# Patient Record
Sex: Female | Born: 1955 | Race: White | Hispanic: No | Smoking: Former smoker
Health system: Southern US, Community
[De-identification: ages and names within clinical notes are randomized; demographics above are authoritative.]

## PROBLEM LIST (undated history)

## (undated) DIAGNOSIS — E119 Type 2 diabetes mellitus without complications: Secondary | ICD-10-CM

## (undated) DIAGNOSIS — E1140 Type 2 diabetes mellitus with diabetic neuropathy, unspecified: Secondary | ICD-10-CM

## (undated) DIAGNOSIS — E785 Hyperlipidemia, unspecified: Secondary | ICD-10-CM

## (undated) DIAGNOSIS — M791 Myalgia, unspecified site: Secondary | ICD-10-CM

## (undated) DIAGNOSIS — I10 Essential (primary) hypertension: Secondary | ICD-10-CM

## (undated) DIAGNOSIS — F411 Generalized anxiety disorder: Secondary | ICD-10-CM

## (undated) DIAGNOSIS — K635 Polyp of colon: Secondary | ICD-10-CM

## (undated) DIAGNOSIS — J45909 Unspecified asthma, uncomplicated: Secondary | ICD-10-CM

## (undated) DIAGNOSIS — G4733 Obstructive sleep apnea (adult) (pediatric): Secondary | ICD-10-CM

## (undated) DIAGNOSIS — J302 Other seasonal allergic rhinitis: Secondary | ICD-10-CM

## (undated) DIAGNOSIS — G4730 Sleep apnea, unspecified: Secondary | ICD-10-CM

## (undated) DIAGNOSIS — E1143 Type 2 diabetes mellitus with diabetic autonomic (poly)neuropathy: Secondary | ICD-10-CM

## (undated) HISTORY — DX: Other seasonal allergic rhinitis: J30.2

## (undated) HISTORY — DX: Type 2 diabetes mellitus without complications: E11.9

## (undated) HISTORY — PX: SHOULDER ARTHROSCOPY: SHX128

## (undated) HISTORY — DX: Hyperlipidemia, unspecified: E78.5

## (undated) HISTORY — DX: Polyp of colon: K63.5

## (undated) HISTORY — PX: OTHER SURGICAL HISTORY: SHX169

---

## 1993-12-20 HISTORY — PX: ABDOMINAL HYSTERECTOMY: SHX81

## 1999-03-30 ENCOUNTER — Ambulatory Visit (HOSPITAL_COMMUNITY): Admission: RE | Admit: 1999-03-30 | Discharge: 1999-03-30 | Payer: Self-pay | Admitting: Endocrinology

## 1999-03-30 ENCOUNTER — Encounter: Payer: Self-pay | Admitting: Endocrinology

## 2001-01-17 ENCOUNTER — Inpatient Hospital Stay (HOSPITAL_COMMUNITY): Admission: AD | Admit: 2001-01-17 | Discharge: 2001-01-18 | Payer: Self-pay | Admitting: General Surgery

## 2001-01-30 ENCOUNTER — Encounter: Payer: Self-pay | Admitting: Surgery

## 2001-01-30 ENCOUNTER — Ambulatory Visit (HOSPITAL_COMMUNITY): Admission: RE | Admit: 2001-01-30 | Discharge: 2001-01-30 | Payer: Self-pay | Admitting: Surgery

## 2001-02-20 ENCOUNTER — Encounter: Payer: Self-pay | Admitting: Surgery

## 2001-02-20 ENCOUNTER — Ambulatory Visit (HOSPITAL_COMMUNITY): Admission: RE | Admit: 2001-02-20 | Discharge: 2001-02-20 | Payer: Self-pay | Admitting: Surgery

## 2001-10-05 ENCOUNTER — Ambulatory Visit (HOSPITAL_COMMUNITY): Admission: RE | Admit: 2001-10-05 | Discharge: 2001-10-05 | Payer: Self-pay | Admitting: Internal Medicine

## 2001-10-05 ENCOUNTER — Encounter: Payer: Self-pay | Admitting: Internal Medicine

## 2004-09-30 ENCOUNTER — Ambulatory Visit (HOSPITAL_COMMUNITY): Admission: RE | Admit: 2004-09-30 | Discharge: 2004-09-30 | Payer: Self-pay | Admitting: Specialist

## 2004-11-11 ENCOUNTER — Ambulatory Visit: Payer: Self-pay | Admitting: Unknown Physician Specialty

## 2005-03-09 ENCOUNTER — Ambulatory Visit: Payer: Self-pay | Admitting: Endocrinology

## 2005-04-27 ENCOUNTER — Ambulatory Visit: Payer: Self-pay | Admitting: Endocrinology

## 2005-04-30 ENCOUNTER — Ambulatory Visit: Payer: Self-pay | Admitting: Endocrinology

## 2005-06-28 ENCOUNTER — Ambulatory Visit: Payer: Self-pay | Admitting: Endocrinology

## 2005-06-30 ENCOUNTER — Ambulatory Visit: Payer: Self-pay | Admitting: Endocrinology

## 2005-07-27 ENCOUNTER — Ambulatory Visit: Payer: Self-pay | Admitting: Endocrinology

## 2005-08-17 ENCOUNTER — Ambulatory Visit: Payer: Self-pay | Admitting: Internal Medicine

## 2005-09-22 ENCOUNTER — Ambulatory Visit: Payer: Self-pay | Admitting: Endocrinology

## 2005-09-24 ENCOUNTER — Ambulatory Visit: Payer: Self-pay | Admitting: Endocrinology

## 2005-10-07 ENCOUNTER — Ambulatory Visit: Payer: Self-pay | Admitting: Internal Medicine

## 2005-10-08 ENCOUNTER — Ambulatory Visit: Payer: Self-pay

## 2005-12-20 HISTORY — PX: SHOULDER SURGERY: SHX246

## 2006-04-14 ENCOUNTER — Emergency Department: Payer: Self-pay | Admitting: Internal Medicine

## 2006-05-30 DIAGNOSIS — E1165 Type 2 diabetes mellitus with hyperglycemia: Secondary | ICD-10-CM | POA: Insufficient documentation

## 2006-05-30 DIAGNOSIS — E7800 Pure hypercholesterolemia, unspecified: Secondary | ICD-10-CM | POA: Insufficient documentation

## 2006-11-01 ENCOUNTER — Ambulatory Visit: Payer: Self-pay | Admitting: Family Medicine

## 2007-03-28 ENCOUNTER — Ambulatory Visit: Payer: Self-pay | Admitting: Unknown Physician Specialty

## 2007-11-07 ENCOUNTER — Ambulatory Visit: Payer: Self-pay | Admitting: Unknown Physician Specialty

## 2008-05-02 ENCOUNTER — Emergency Department: Payer: Self-pay | Admitting: Emergency Medicine

## 2008-06-25 ENCOUNTER — Ambulatory Visit: Payer: Self-pay | Admitting: Gastroenterology

## 2008-07-18 ENCOUNTER — Ambulatory Visit: Payer: Self-pay | Admitting: Gastroenterology

## 2008-09-30 ENCOUNTER — Ambulatory Visit: Payer: Self-pay | Admitting: Family Medicine

## 2008-10-09 ENCOUNTER — Ambulatory Visit: Payer: Self-pay | Admitting: Unknown Physician Specialty

## 2008-10-20 ENCOUNTER — Ambulatory Visit: Payer: Self-pay | Admitting: Unknown Physician Specialty

## 2008-11-07 ENCOUNTER — Ambulatory Visit: Payer: Self-pay | Admitting: Unknown Physician Specialty

## 2008-11-19 ENCOUNTER — Ambulatory Visit: Payer: Self-pay | Admitting: Unknown Physician Specialty

## 2008-12-16 DIAGNOSIS — G4730 Sleep apnea, unspecified: Secondary | ICD-10-CM | POA: Insufficient documentation

## 2008-12-20 ENCOUNTER — Ambulatory Visit: Payer: Self-pay | Admitting: Unknown Physician Specialty

## 2009-02-27 ENCOUNTER — Ambulatory Visit: Payer: Self-pay | Admitting: Family Medicine

## 2009-02-27 DIAGNOSIS — M542 Cervicalgia: Secondary | ICD-10-CM | POA: Insufficient documentation

## 2009-02-27 DIAGNOSIS — J45909 Unspecified asthma, uncomplicated: Secondary | ICD-10-CM | POA: Insufficient documentation

## 2009-09-03 ENCOUNTER — Ambulatory Visit: Payer: Self-pay | Admitting: Gastroenterology

## 2009-11-10 ENCOUNTER — Ambulatory Visit: Payer: Self-pay | Admitting: Unknown Physician Specialty

## 2010-04-28 ENCOUNTER — Emergency Department: Payer: Self-pay | Admitting: Emergency Medicine

## 2010-09-29 ENCOUNTER — Ambulatory Visit: Payer: Self-pay | Admitting: Unknown Physician Specialty

## 2011-01-14 ENCOUNTER — Ambulatory Visit: Payer: Self-pay | Admitting: Internal Medicine

## 2011-04-17 ENCOUNTER — Emergency Department: Payer: Self-pay | Admitting: Emergency Medicine

## 2011-09-30 ENCOUNTER — Ambulatory Visit: Payer: Self-pay | Admitting: Family Medicine

## 2011-11-17 ENCOUNTER — Ambulatory Visit: Payer: Self-pay | Admitting: Internal Medicine

## 2012-10-04 ENCOUNTER — Ambulatory Visit: Payer: Self-pay | Admitting: Family Medicine

## 2012-10-31 ENCOUNTER — Ambulatory Visit: Payer: Self-pay | Admitting: Family Medicine

## 2012-10-31 LAB — CREATININE, SERUM: EGFR (African American): >60

## 2013-02-28 ENCOUNTER — Encounter: Payer: Self-pay | Admitting: Family Medicine

## 2013-02-28 ENCOUNTER — Ambulatory Visit (INDEPENDENT_AMBULATORY_CARE_PROVIDER_SITE_OTHER): Payer: BC Managed Care – PPO | Admitting: Family Medicine

## 2013-02-28 VITALS — BP 120/74 | HR 86 | Temp 98.2°F | Ht 62.0 in | Wt 190.0 lb

## 2013-02-28 DIAGNOSIS — E785 Hyperlipidemia, unspecified: Secondary | ICD-10-CM

## 2013-02-28 DIAGNOSIS — E119 Type 2 diabetes mellitus without complications: Secondary | ICD-10-CM

## 2013-02-28 DIAGNOSIS — M549 Dorsalgia, unspecified: Secondary | ICD-10-CM

## 2013-02-28 MED ORDER — TIZANIDINE HCL 4 MG PO TABS: 4.0000 mg | ORAL_TABLET | Freq: Every evening | ORAL | Status: DC

## 2013-02-28 NOTE — Patient Instructions (Addendum)
[facility] 1. Work on pretzel stretching, shoulder back and leg draped in front. 3-5 sets, 30 sec.. 3. cross over stretching - shoulder back to ground, same side leg crossover. 3-5 sets for 30 min..  4. SINK STRETCH - YOU CAN DO THIS WHENEVER YOU WANT DURING THE DAY

## 2013-02-28 NOTE — Progress Notes (Signed)
Nature conservation officer at [facility] [address] [place] [address] Phone: [phone] Fax: [phone]  Date:  02/28/2013   Name:  Jessica Elliott   DOB:  08/01/1956   MRN:  [id number] Gender: female Age: 57 y.o.  Primary Physician:  Hannah Beat, MD  Evaluating MD: Hannah Beat, MD   Chief Complaint: Establish Care and Back Pain   History of Present Illness:  Jessica Elliott is a 57 y.o. pleasant patient who presents with the following:  New patient, here to see me on a consultant basis, Woke with posterior pain, and some numbness in the L leg. Pain in the left side. Not radiating. "Pulling sensation."   L upper thigh. Woke up and her back hurting.  Some pain in the buttocks. No trauma, accident. No bowel or bladder incontinence.   Got a little better. Heat and ice for fifteen minutes.  Computer job.   DM decently controlled.   There is no problem list on file for this patient.   Past Medical History  Diagnosis Date  . Hyperlipidemia   . Diabetes mellitus without complication   . Allergy   . Polyp of colon     Past Surgical History  Procedure Laterality Date  . Abdominal hysterectomy    . Shoulder arthroscopy      History   Social History  . Marital Status: Single    Spouse Name: N/A    Number of Children: N/A  . Years of Education: N/A   Occupational History  . Not on file.   Social History Main Topics  . Smoking status: Former Games developer  . Smokeless tobacco: Not on file  . Alcohol Use: Yes  . Drug Use: No  . Sexually Active: Not on file   Other Topics Concern  . Not on file   Social History Narrative  . No narrative on file    No family history on file.  Allergies  Allergen Reactions  . Codeine Hives  . Penicillins Hives    Medication list has been reviewed and updated.  No outpatient prescriptions prior to visit.   No facility-administered medications prior to visit.    Review of Systems:   GEN: No  fevers, chills. Nontoxic. Primarily MSK c/o today. MSK: Detailed in the HPI GI: tolerating PO intake without difficulty Neuro: as above Otherwise the pertinent positives of the ROS are noted above.    Physical Examination: BP 120/74  Pulse 86  Temp(Src) 98.2 F (36.8 C) (Oral)  Ht 5\' 2"  (1.575 m)  Wt 190 lb (86.183 kg)  BMI 34.74 kg/m2  SpO2 97%  Ideal Body Weight: Weight in (lb) to have BMI = 25: 136.4   GEN: Well-developed,well-nourished,in no acute distress; alert,appropriate and cooperative throughout examination HEENT: Normocephalic and atraumatic without obvious abnormalities. Ears, externally no deformities PULM: Breathing comfortably in no respiratory distress EXT: No clubbing, cyanosis, or edema PSYCH: Normally interactive. Cooperative during the interview. Pleasant. Friendly and conversant. Not anxious or depressed appearing. Normal, full affect.  Range of motion at  the waist: Flexion, extension, lateral bending and rotation: approaching normal  No echymosis or edema Rises to examination table with mild difficulty Gait: minimally antalgic  Inspection/Deformity: N Paraspinus Tenderness: mild l3-5, but more in upper posterior buttocks  B Ankle Dorsiflexion (L5,4): 5/5 B Great Toe Dorsiflexion (L5,4): 5/5 Heel Walk (L5): WNL Toe Walk (S1): WNL Rise/Squat (L4): WNL, mild pain  SENSORY B Medial Foot (L4): WNL B Dorsum (L5): WNL B Lateral (S1): WNL  Light Touch: ? Mild altered sensation upper L thigh  REFLEXES Knee (L4): 2+ Ankle (S1): 2+  B SLR, seated: neg B SLR, supine: neg B FABER: neg B Reverse FABER: neg B Greater Troch: NT B Log Roll: neg B Stork: NT B Sciatic Notch: NT   Assessment and Plan:  Back pain  Diabetes mellitus without complication  Hyperlipidemia  Back pain, sensation potentially slightly altered, tight hips. But motion preserved. Proceed with conservative therapy, rehab, muscle relaxants at night. Back and hip rehab  reviewed.  Orders Today:  No orders of the defined types were placed in this encounter.    Updated Medication List: (Includes new medications, updates to list, dose adjustments) Meds ordered this encounter  Medications  . insulin lispro (HUMALOG) 100 UNIT/ML injection    Sig: 16 unit am 18 noon 20 dinner  . insulin glargine (LANTUS) 100 UNIT/ML injection    Sig: Inject 46 Units into the skin at bedtime.  . metFORMIN (GLUCOPHAGE) 1000 MG tablet    Sig: Take 1000 mg in am and 1500 mg at bedtime  . lovastatin (MEVACOR) 10 MG tablet    Sig: Take 10 mg by mouth at bedtime.  Marland Kitchen aspirin 81 MG chewable tablet    Sig: Chew 81 mg by mouth daily.  Marland Kitchen tiZANidine (ZANAFLEX) 4 MG tablet    Sig: Take 1 tablet (4 mg total) by mouth Nightly.    Dispense:  30 tablet    Refill:  2    Medications Discontinued: There are no discontinued medications.    Signed, Elpidio Galea. Copland, MD 02/28/2013 9:46 AM

## 2013-03-02 ENCOUNTER — Encounter: Payer: Self-pay | Admitting: Family Medicine

## 2013-03-02 DIAGNOSIS — E119 Type 2 diabetes mellitus without complications: Secondary | ICD-10-CM | POA: Insufficient documentation

## 2013-03-02 DIAGNOSIS — E785 Hyperlipidemia, unspecified: Secondary | ICD-10-CM | POA: Insufficient documentation

## 2013-04-04 ENCOUNTER — Ambulatory Visit: Payer: BC Managed Care – PPO | Admitting: Family Medicine

## 2013-09-27 ENCOUNTER — Ambulatory Visit: Payer: Self-pay | Admitting: Family Medicine

## 2014-05-23 LAB — BASIC METABOLIC PANEL
BUN: 14 mg/dL (ref 4–21)
Creatinine: 0.6 mg/dL (ref 0.5–1.1)
GLUCOSE: 157 mg/dL
Potassium: 4.8 mmol/L (ref 3.4–5.3)
SODIUM: 141 mmol/L (ref 137–147)

## 2014-05-23 LAB — HEMOGLOBIN A1C: Hgb A1c MFr Bld: 7.1 % — AB (ref 4.0–6.0)

## 2014-05-23 LAB — POCT ERYTHROCYTE SEDIMENTATION RATE, NON-AUTOMATED: Sed Rate: 3 mm

## 2014-05-28 ENCOUNTER — Ambulatory Visit: Payer: Self-pay | Admitting: Family Medicine

## 2014-09-18 LAB — LIPID PANEL
CHOLESTEROL: 174 mg/dL (ref 0–200)
HDL: 63 mg/dL (ref 35–70)
LDL CALC: 95 mg/dL
Triglycerides: 82 mg/dL (ref 40–160)

## 2014-09-18 LAB — HEPATIC FUNCTION PANEL
ALT: 21 U/L (ref 7–35)
AST: 19 U/L (ref 13–35)
Alkaline Phosphatase: 56 U/L (ref 25–125)
Bilirubin, Total: 0.3 mg/dL

## 2014-10-04 ENCOUNTER — Ambulatory Visit: Payer: Self-pay | Admitting: Family Medicine

## 2014-10-08 ENCOUNTER — Other Ambulatory Visit: Payer: Self-pay | Admitting: Family Medicine

## 2014-10-09 ENCOUNTER — Other Ambulatory Visit: Payer: Self-pay | Admitting: Family Medicine

## 2014-10-09 DIAGNOSIS — N6459 Other signs and symptoms in breast: Secondary | ICD-10-CM

## 2014-10-18 ENCOUNTER — Ambulatory Visit
Admission: RE | Admit: 2014-10-18 | Discharge: 2014-10-18 | Disposition: A | Discharge status: Home / Self Care | Payer: BC Managed Care – PPO | Source: Ambulatory Visit | Attending: Family Medicine | Admitting: Family Medicine

## 2014-10-18 DIAGNOSIS — N6459 Other signs and symptoms in breast: Secondary | ICD-10-CM

## 2014-12-17 ENCOUNTER — Ambulatory Visit: Payer: Self-pay | Admitting: Gastroenterology

## 2015-04-14 LAB — SURGICAL PATHOLOGY

## 2015-04-29 DIAGNOSIS — K5790 Diverticulosis of intestine, part unspecified, without perforation or abscess without bleeding: Secondary | ICD-10-CM | POA: Insufficient documentation

## 2015-04-29 DIAGNOSIS — K649 Unspecified hemorrhoids: Secondary | ICD-10-CM | POA: Insufficient documentation

## 2015-04-29 DIAGNOSIS — B029 Zoster without complications: Secondary | ICD-10-CM | POA: Insufficient documentation

## 2015-04-29 DIAGNOSIS — K635 Polyp of colon: Secondary | ICD-10-CM | POA: Insufficient documentation

## 2015-04-29 DIAGNOSIS — F419 Anxiety disorder, unspecified: Secondary | ICD-10-CM | POA: Insufficient documentation

## 2015-04-29 DIAGNOSIS — E559 Vitamin D deficiency, unspecified: Secondary | ICD-10-CM | POA: Insufficient documentation

## 2015-04-29 DIAGNOSIS — M19049 Primary osteoarthritis, unspecified hand: Secondary | ICD-10-CM | POA: Insufficient documentation

## 2015-04-29 DIAGNOSIS — R768 Other specified abnormal immunological findings in serum: Secondary | ICD-10-CM | POA: Insufficient documentation

## 2015-04-29 DIAGNOSIS — D649 Anemia, unspecified: Secondary | ICD-10-CM | POA: Insufficient documentation

## 2015-04-29 DIAGNOSIS — M7710 Lateral epicondylitis, unspecified elbow: Secondary | ICD-10-CM | POA: Insufficient documentation

## 2015-04-29 DIAGNOSIS — N6459 Other signs and symptoms in breast: Secondary | ICD-10-CM | POA: Insufficient documentation

## 2015-06-16 ENCOUNTER — Other Ambulatory Visit: Payer: Self-pay | Admitting: Family Medicine

## 2015-06-25 ENCOUNTER — Encounter: Payer: Self-pay | Admitting: Physician Assistant

## 2015-06-25 ENCOUNTER — Ambulatory Visit (INDEPENDENT_AMBULATORY_CARE_PROVIDER_SITE_OTHER): Payer: BLUE CROSS/BLUE SHIELD | Admitting: Physician Assistant

## 2015-06-25 VITALS — BP 132/60 | HR 80 | Temp 98.0°F | Resp 16 | Ht 62.0 in | Wt 202.0 lb

## 2015-06-25 DIAGNOSIS — E1165 Type 2 diabetes mellitus with hyperglycemia: Secondary | ICD-10-CM

## 2015-06-25 DIAGNOSIS — R42 Dizziness and giddiness: Secondary | ICD-10-CM

## 2015-06-25 DIAGNOSIS — E7800 Pure hypercholesterolemia, unspecified: Secondary | ICD-10-CM

## 2015-06-25 DIAGNOSIS — I739 Peripheral vascular disease, unspecified: Secondary | ICD-10-CM | POA: Diagnosis not present

## 2015-06-25 DIAGNOSIS — R600 Localized edema: Secondary | ICD-10-CM | POA: Diagnosis not present

## 2015-06-25 DIAGNOSIS — G4700 Insomnia, unspecified: Secondary | ICD-10-CM | POA: Diagnosis not present

## 2015-06-25 DIAGNOSIS — E559 Vitamin D deficiency, unspecified: Secondary | ICD-10-CM | POA: Diagnosis not present

## 2015-06-25 DIAGNOSIS — IMO0002 Reserved for concepts with insufficient information to code with codable children: Secondary | ICD-10-CM

## 2015-06-25 MED ORDER — FUROSEMIDE 20 MG PO TABS
20.0000 mg | ORAL_TABLET | Freq: Every day | ORAL | Status: DC
Start: 2015-06-25 — End: 2015-10-02

## 2015-06-25 NOTE — Progress Notes (Signed)
Subjective:     Patient ID: Jessica Elliott, female   DOB: [birth year], 59 y.o.   MRN: [id number]  Anxiety Presents for follow-up visit. Onset was 1 to 6 months ago (at least 3 month). The problem has been unchanged. Symptoms include dizziness, insomnia and shortness of breath. Patient reports no chest pain, depressed mood, excessive worry, feeling of choking, hyperventilation, nausea, palpitations or suicidal ideas. Symptoms occur constantly. The severity of symptoms is moderate. Nothing aggravates the symptoms. The quality of sleep is poor. Nighttime awakenings: several.   There are no known risk factors. Treatments tried: stared Xanax 0.5mg  1 tablet at bedtime. The treatment provided no relief. Compliance with prior treatments has been good.  Leg Pain  The incident occurred more than 1 week ago (has been ongoing for at over 3 months now). There was no injury mechanism. The pain is present in the left leg and right leg. The quality of the pain is described as aching and burning. The pain is moderate (can be severe). The pain has been constant since onset. Associated symptoms include numbness (bilateral feet and legs) and tingling. The symptoms are aggravated by weight bearing. She has tried elevation and NSAIDs (also has tried TED hose ) for the symptoms. The treatment provided no relief.  Dizziness This is a new problem. The current episode started more than 1 month ago. The problem occurs 2 to 4 times per day. The problem has been unchanged. Associated symptoms include arthralgias (left wrist), fatigue and numbness (bilateral feet and legs). Pertinent negatives include no abdominal pain, anorexia, change in bowel habit, chest pain, chills, congestion, coughing, diaphoresis, fever, headaches, joint swelling, myalgias, nausea, neck pain, rash, sore throat, swollen glands, urinary symptoms, vertigo, visual change (recently went to opthamologist and had normal exam), vomiting or weakness. The symptoms are  aggravated by exertion and walking. She has tried drinking, eating and rest for the symptoms. The treatment provided no relief.  Patient was seen in the office on 04/29/15 and was started on Xanax 0.5mg  as needed at bedtime. Patient reports that she can tell a difference in the amount of hours she is getting a night. However, she still has the "funny feeling" in her head throughout the day. Patient thinks that the sensation she is having in her head and leg pain is related.   Review of Systems  Constitutional: Positive for fatigue. Negative for fever, chills, diaphoresis, activity change and appetite change.  HENT: Negative for congestion, sore throat, tinnitus, trouble swallowing and voice change.   Eyes: Negative.   Respiratory: Positive for shortness of breath. Negative for cough, chest tightness and wheezing.   Cardiovascular: Positive for leg swelling. Negative for chest pain and palpitations.  Gastrointestinal: Negative for nausea, vomiting, abdominal pain, diarrhea, constipation, anorexia and change in bowel habit.  Endocrine: Negative.   Genitourinary: Negative.   Musculoskeletal: Positive for arthralgias (left wrist). Negative for myalgias, joint swelling and neck pain.  Skin: Negative for rash.  Allergic/Immunologic: Negative.   Neurological: Positive for dizziness, tingling, light-headedness and numbness (bilateral feet and legs). Negative for vertigo, weakness and headaches.  Hematological: Negative.   Psychiatric/Behavioral: Negative for suicidal ideas. The patient has insomnia.        Objective:   Physical Exam  Constitutional: She is oriented to person, place, and time. She appears well-developed and well-nourished. No distress.  Eyes: Pupils are equal, round, and reactive to light.  Cardiovascular: Normal rate, regular rhythm and normal heart sounds.  Exam reveals no gallop and no friction  rub.   No murmur heard. Pulses:      Dorsalis pedis pulses are 1+ on the right side,  and 1+ on the left side.  Pulmonary/Chest: Effort normal and breath sounds normal. No respiratory distress. She has no wheezes. She has no rales.  Neurological: She is alert and oriented to person, place, and time. No cranial nerve deficit. Coordination normal.  Skin: She is not diaphoretic.  Psychiatric: She has a normal mood and affect. Her behavior is normal. Judgment and thought content normal.  Vitals reviewed.      Assessment:     1. Dizziness   2. Hypercholesterolemia without hypertriglyceridemia   3. Diabetes mellitus type 2, uncontrolled   4. Avitaminosis D   5. Bilateral leg edema   6. Middle insomnia   7. Claudication of both lower extremities        Plan:     1. Dizziness Will check labs.  Advised to make sure she is drinking plenty of fluids.  Will recheck in 2 weeks.  If no improvement will refer to Dr. Rowan Blase Neurology (she has seen him previously for headache). - TSH - Comprehensive Metabolic Panel (CMET)  2. Hypercholesterolemia without hypertriglyceridemia Check labs and f/u pending labs. - Lipid panel  3. Diabetes mellitus type 2, uncontrolled Will check labs to make sure blood glucose is not extremely uncontrolled causing progression of symptoms.  She is followed by Endocrinology for her T2DM. - HgB A1c - CBC w/Diff  4. Avitaminosis D Hx of this and currently taking a vitamin D supplement but labs have not been checked in over one year.  F/U pending labs. - Vitamin D (25 hydroxy)  5. Bilateral leg edema Will check labs.  Start furosemide as below.  Will follow up in 2 weeks. - Comprehensive Metabolic Panel (CMET) - furosemide (LASIX) 20 MG tablet; Take 1 tablet (20 mg total) by mouth daily.  Dispense: 30 tablet; Refill: 3  6. Middle insomnia Discussed possible use of zolpidem 5 mg to help her stay alseep.  She does not wish to proceed with this at this time.  Also discussed possibly having her repeat a sleep study to make sure her CPAP  parameters are still appropriate as she has had her current machine for many years.  7. Claudication of both lower extremities Will check ABIs.  She does complain of fatigue and burning in her legs from her buttocks and calf muscles bilaterally even after walking short distances.  She did have palpable DP pulses bilaterally on exam and feet are warm to the touch with no discoloration.  Will obtain ABI when she returns to R/O vascular involvement.  I question more likely a progression of neuropathy.  If ABIs are stable will discuss trial of gabapentin.  - ABI; Future

## 2015-07-02 ENCOUNTER — Other Ambulatory Visit: Payer: Self-pay | Admitting: Family Medicine

## 2015-07-10 ENCOUNTER — Telehealth: Payer: Self-pay | Admitting: Physician Assistant

## 2015-07-10 LAB — COMPREHENSIVE METABOLIC PANEL
ALK PHOS: 55 IU/L (ref 39–117)
ALT: 24 IU/L (ref 0–32)
AST: 21 IU/L (ref 0–40)
Albumin/Globulin Ratio: 2.1 (ref 1.1–2.5)
Albumin: 4.6 g/dL (ref 3.5–5.5)
BUN / CREAT RATIO: 22 (ref 9–23)
BUN: 15 mg/dL (ref 6–24)
Bilirubin Total: 0.4 mg/dL (ref 0.0–1.2)
CO2: 25 mmol/L (ref 18–29)
Calcium: 8.9 mg/dL (ref 8.7–10.2)
Chloride: 102 mmol/L (ref 97–108)
Creatinine, Ser: 0.67 mg/dL (ref 0.57–1.00)
GFR calc Af Amer: 113 mL/min/{1.73_m2} (ref >59)
GFR, EST NON AFRICAN AMERICAN: 98 mL/min/{1.73_m2} (ref >59)
GLUCOSE: 143 mg/dL — AB (ref 65–99)
Globulin, Total: 2.2 g/dL (ref 1.5–4.5)
Potassium: 4.5 mmol/L (ref 3.5–5.2)
Sodium: 143 mmol/L (ref 134–144)
TOTAL PROTEIN: 6.8 g/dL (ref 6.0–8.5)

## 2015-07-10 LAB — LIPID PANEL
CHOL/HDL RATIO: 2.5 ratio (ref 0.0–4.4)
Cholesterol, Total: 133 mg/dL (ref 100–199)
HDL: 54 mg/dL (ref >39)
LDL CALC: 62 mg/dL (ref 0–99)
Triglycerides: 85 mg/dL (ref 0–149)
VLDL Cholesterol Cal: 17 mg/dL (ref 5–40)

## 2015-07-10 LAB — CBC WITH DIFFERENTIAL/PLATELET
BASOS: 0 %
Basophils Absolute: 0 10*3/uL (ref 0.0–0.2)
EOS (ABSOLUTE): 0.1 10*3/uL (ref 0.0–0.4)
EOS: 3 %
HEMATOCRIT: 37.8 % (ref 34.0–46.6)
HEMOGLOBIN: 12.5 g/dL (ref 11.1–15.9)
IMMATURE GRANULOCYTES: 0 %
Immature Grans (Abs): 0 10*3/uL (ref 0.0–0.1)
Lymphocytes Absolute: 1.8 10*3/uL (ref 0.7–3.1)
Lymphs: 38 %
MCH: 25.6 pg — ABNORMAL LOW (ref 26.6–33.0)
MCHC: 33.1 g/dL (ref 31.5–35.7)
MCV: 78 fL — ABNORMAL LOW (ref 79–97)
Monocytes Absolute: 0.5 10*3/uL (ref 0.1–0.9)
Monocytes: 11 %
Neutrophils Absolute: 2.2 10*3/uL (ref 1.4–7.0)
Neutrophils: 48 %
Platelets: 190 10*3/uL (ref 150–379)
RBC: 4.88 x10E6/uL (ref 3.77–5.28)
RDW: 14.5 % (ref 12.3–15.4)
WBC: 4.7 10*3/uL (ref 3.4–10.8)

## 2015-07-10 LAB — HEMOGLOBIN A1C
Est. average glucose Bld gHb Est-mCnc: 177 mg/dL
Hgb A1c MFr Bld: 7.8 % — ABNORMAL HIGH (ref 4.8–5.6)

## 2015-07-10 LAB — TSH: TSH: 2.82 u[IU]/mL (ref 0.450–4.500)

## 2015-07-10 LAB — VITAMIN D 25 HYDROXY (VIT D DEFICIENCY, FRACTURES): VIT D 25 HYDROXY: 30 ng/mL (ref 30.0–100.0)

## 2015-07-10 NOTE — Telephone Encounter (Signed)
Left message to Sabrina from [facility] health regarding pt's order for ABI.

## 2015-07-10 NOTE — Telephone Encounter (Signed)
Tried to call, waited for over 15 mins.  Will call back.

## 2015-07-10 NOTE — Telephone Encounter (Signed)
Received an incoming call from [place] from [facility] regarding an ABI order, she wanted to know if you want the pt to get it there that she also will need a Lower arterial order, and would like to know when to schedule it.

## 2015-07-10 NOTE — Telephone Encounter (Signed)
No we are doing it in the office tomorrow.  This was the patient we were working on the in-office workflow for this procedure.  I think she is supposed to get it tomorrow in the office around 1030.

## 2015-07-11 ENCOUNTER — Encounter (INDEPENDENT_AMBULATORY_CARE_PROVIDER_SITE_OTHER): Payer: BLUE CROSS/BLUE SHIELD

## 2015-07-11 ENCOUNTER — Encounter: Payer: Self-pay | Admitting: Physician Assistant

## 2015-07-11 ENCOUNTER — Ambulatory Visit (INDEPENDENT_AMBULATORY_CARE_PROVIDER_SITE_OTHER): Payer: BLUE CROSS/BLUE SHIELD | Admitting: Physician Assistant

## 2015-07-11 VITALS — BP 120/60 | HR 80 | Temp 97.3°F | Resp 17 | Wt 200.0 lb

## 2015-07-11 DIAGNOSIS — G8929 Other chronic pain: Secondary | ICD-10-CM

## 2015-07-11 DIAGNOSIS — M25521 Pain in right elbow: Secondary | ICD-10-CM | POA: Diagnosis not present

## 2015-07-11 DIAGNOSIS — R600 Localized edema: Secondary | ICD-10-CM | POA: Diagnosis not present

## 2015-07-11 DIAGNOSIS — E1140 Type 2 diabetes mellitus with diabetic neuropathy, unspecified: Secondary | ICD-10-CM | POA: Insufficient documentation

## 2015-07-11 DIAGNOSIS — E1165 Type 2 diabetes mellitus with hyperglycemia: Secondary | ICD-10-CM

## 2015-07-11 DIAGNOSIS — E1142 Type 2 diabetes mellitus with diabetic polyneuropathy: Secondary | ICD-10-CM | POA: Diagnosis not present

## 2015-07-11 DIAGNOSIS — I951 Orthostatic hypotension: Secondary | ICD-10-CM | POA: Diagnosis not present

## 2015-07-11 DIAGNOSIS — IMO0002 Reserved for concepts with insufficient information to code with codable children: Secondary | ICD-10-CM

## 2015-07-11 MED ORDER — FUROSEMIDE 20 MG PO TABS: 20.0000 mg | ORAL_TABLET | Freq: Every day | ORAL | Status: DC

## 2015-07-11 MED ORDER — HYDROCODONE-ACETAMINOPHEN 10-325 MG PO TABS: 1.0000 | ORAL_TABLET | Freq: Four times a day (QID) | ORAL | Status: DC | PRN

## 2015-07-11 NOTE — Patient Instructions (Signed)

## 2015-07-11 NOTE — Progress Notes (Signed)
Patient: Jessica Elliott Female    DOB: Jun 16, 1956   59 y.o.   MRN: [id number] Visit Date: 07/11/2015  Today's Provider: Mar Daring, PA-C   Chief Complaint  Patient presents with  . Follow-up    Dizziness, Leg Edema   Subjective:    HPI Jessica Elliott is a 59 year old female that returns to the office today for follow-up of her bilateral lower extremity edema and dizziness. She does feel that the Lasix that was given to her at the previous visit has helped her lower extremity edema. She also feels that the dizziness is not as frequent. She does however report that there are still some days when she has to sit a lot at work she will feel her legs become tight and feel very swollen and heavy. When she has these sensations in her legs is when she will notice the dizziness upon standing. She states the dizziness only occurs at work and it only occurs after she has been sitting for prolonged period of time and when she stands up she will feel lightheaded and her head will throb and she feels flushed in the face. She states these episodes only last for approximately 10-20 seconds and normally resolve if she sits down. She has been trying to increase, she is walking daily and feels this has helped her lower extremity swelling as well.    Allergies  Allergen Reactions  . Ibuprofen Other (See Comments)    Drowsy  . Statins Other (See Comments)  . Codeine Hives  . Penicillins Hives   Previous Medications   ALBUTEROL (PROVENTIL HFA;VENTOLIN HFA) 108 (90 BASE) MCG/ACT INHALER    Inhale 2 puffs into the lungs every 6 (six) hours as needed.   ALPRAZOLAM (XANAX) 0.5 MG TABLET    Take 1 tablet by mouth every 8 (eight) hours as needed.   ASPIRIN 81 MG TABLET    Take 81 mg by mouth daily.   DAPAGLIFLOZIN PROPANEDIOL (FARXIGA) 5 MG TABS TABLET    1 tablet daily.   FUROSEMIDE (LASIX) 20 MG TABLET    Take 1 tablet (20 mg total) by mouth daily.   HYDROCODONE-ACETAMINOPHEN (NORCO) 10-325  MG PER TABLET    Take 1 tablet by mouth every 6 (six) hours as needed.   INSULIN ASPART (NOVOLOG) 100 UNIT/ML INJECTION    Inject into the skin. 16-18-20 three times daily   INSULIN GLARGINE (LANTUS) 100 UNIT/ML INJECTION    Inject 46 Units into the skin at bedtime.   INSULIN PEN NEEDLE (B-D ULTRAFINE III SHORT PEN) 31G X 8 MM MISC    Inject insulin 4 times daily   LANTUS SOLOSTAR 100 UNIT/ML SOLOSTAR PEN       LORATADINE (CLARITIN) 10 MG TABLET    Take 1 tablet by mouth daily.   LOVASTATIN (MEVACOR) 40 MG TABLET       METFORMIN (GLUCOPHAGE) 1000 MG TABLET    Take 1000 mg in am and 1500 mg at bedtime   TIZANIDINE (ZANAFLEX) 4 MG TABLET    Take 1 tablet (4 mg total) by mouth Nightly.   VITAMIN D, ERGOCALCIFEROL, (DRISDOL) 50000 UNITS CAPS CAPSULE    TAKE 1 CAPSULE ONCE A MONTH    Review of Systems  Constitutional: Negative.   HENT: Negative.   Eyes: Negative.   Respiratory: Negative.   Cardiovascular: Negative.   Gastrointestinal: Negative.   Endocrine: Negative.   Genitourinary: Negative.   Musculoskeletal: Positive for joint swelling (Just the leg swelling).  Skin: Negative.   Allergic/Immunologic:       Seasonal alllergies  Neurological: Positive for dizziness.  Hematological: Negative.   Psychiatric/Behavioral: Negative.     History  Substance Use Topics  . Smoking status: Former Research scientist (life sciences)  . Smokeless tobacco: Never Used  . Alcohol Use: 0.0 oz/week    0 Standard drinks or equivalent per week     Comment: occasionally   Objective:   BP 120/60 mmHg  Pulse 80  Temp(Src) 97.3 F (36.3 C) (Oral)  Resp 17  Wt 200 lb (90.719 kg)  Physical Exam  Constitutional: She appears well-developed and well-nourished. No distress.  HENT:  Head: Normocephalic and atraumatic.  Right Ear: Hearing, tympanic membrane, external ear and ear canal normal.  Left Ear: Hearing, tympanic membrane, external ear and ear canal normal.  Eyes: Pupils are equal, round, and reactive to light.  Neck:  Carotid bruit is not present.  Cardiovascular: Normal rate, regular rhythm, normal heart sounds and intact distal pulses.  Exam reveals no gallop and no friction rub.   No murmur heard. Pulmonary/Chest: Effort normal and breath sounds normal. No respiratory distress. She has no wheezes. She has no rales.  Musculoskeletal: Normal range of motion. She exhibits no edema or tenderness.  Neurological: She is alert. No cranial nerve deficit. Coordination normal.  Skin: She is not diaphoretic.  Vitals reviewed.       Assessment & Plan:     1. Bilateral leg edema Check ABIs today in the office due to claudication type symptoms she was complaining of at last visit. Right ABI was 0.97 left ABI was 1.01 which are normal. Advised to continue taking the furosemide since it is helping the lower extremity edema. We did discuss that this is most likely secondary to venous insufficiency as the symptoms she describes are: bilateral lower extremity edema, some darkening of the skin of the lower extremities, heaviness of the legs after prolonged sitting, and a toothache-like sensation in the lower extremities when they are swollen.  At this time she does not want to go through with a venous duplex to check for venous insufficiency and reflux. She is going to continue the furosemide and increasing her daily walks. She may consider compression therapy once it cools down in the fall as I feel this will help the lower extremity edema and the orthostatic hypotension. - furosemide (LASIX) 20 MG tablet; Take 1 tablet (20 mg total) by mouth daily.  Dispense: 30 tablet; Refill: 3  2. Orthostatic hypotension Seems to occur only when her legs are very swollen. Most likely secondary to venous insufficiency causing pulling of the blood in the lower extremities while her legs are in a dependent position and she goes to a standing position. Advise compression therapy may help with the symptoms, stay well-hydrated, continue daily  exercises with walking, and to take her time when she goes from a sitting to a standing position.  3. Diabetes mellitus type 2, uncontrolled Most recent hemoglobin A1c was 7.6 which is an improvement from 8.1. She is followed by endocrinology for this and has a follow-up in September.  4. Diabetic polyneuropathy associated with type 2 diabetes mellitus She is having increased tingling and numbness in her feet bilaterally from the tips of the toes to the balls of the feet. She is not having any pain or discomfort. There are no ulcerations on the bottoms of her feet. She is followed by endocrinology for her diabetes and will discuss this in more detail with them at  her follow-up.  5. Elbow pain, chronic, right Has had workup for this in the past. 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 She states that she has done well since last August but recently her elbow started to give her trouble. 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 - HYDROcodone-acetaminophen (NORCO) 10-325 MG per tablet; Take 1 tablet by mouth every 6 (six) hours as needed.  Dispense: 30 tablet; Refill: 0       Mar Daring, PA-C  [facility]

## 2015-07-22 ENCOUNTER — Other Ambulatory Visit: Payer: Self-pay | Admitting: Family Medicine

## 2015-08-11 ENCOUNTER — Other Ambulatory Visit: Payer: Self-pay | Admitting: Family Medicine

## 2015-09-08 ENCOUNTER — Other Ambulatory Visit: Payer: Self-pay | Admitting: Physician Assistant

## 2015-09-08 DIAGNOSIS — F419 Anxiety disorder, unspecified: Secondary | ICD-10-CM

## 2015-09-08 MED ORDER — ALPRAZOLAM 0.5 MG PO TABS: 0.5000 mg | ORAL_TABLET | Freq: Three times a day (TID) | ORAL | Status: DC | PRN

## 2015-09-08 NOTE — Progress Notes (Signed)
LM letting patient know prescription up front ready for pick. If any message or concerns to call back and asked for Joseline.  Thanks.

## 2015-10-02 ENCOUNTER — Other Ambulatory Visit: Payer: Self-pay | Admitting: Physician Assistant

## 2015-10-02 DIAGNOSIS — R600 Localized edema: Secondary | ICD-10-CM

## 2015-10-10 ENCOUNTER — Encounter: Payer: Self-pay | Admitting: Physician Assistant

## 2015-10-10 ENCOUNTER — Ambulatory Visit (INDEPENDENT_AMBULATORY_CARE_PROVIDER_SITE_OTHER): Payer: BLUE CROSS/BLUE SHIELD | Admitting: Physician Assistant

## 2015-10-10 VITALS — BP 124/70 | HR 78 | Temp 97.4°F | Resp 16 | Wt 199.0 lb

## 2015-10-10 DIAGNOSIS — M25521 Pain in right elbow: Secondary | ICD-10-CM | POA: Diagnosis not present

## 2015-10-10 DIAGNOSIS — E876 Hypokalemia: Secondary | ICD-10-CM | POA: Diagnosis not present

## 2015-10-10 DIAGNOSIS — M791 Myalgia, unspecified site: Secondary | ICD-10-CM | POA: Insufficient documentation

## 2015-10-10 DIAGNOSIS — R600 Localized edema: Secondary | ICD-10-CM | POA: Diagnosis not present

## 2015-10-10 DIAGNOSIS — F419 Anxiety disorder, unspecified: Secondary | ICD-10-CM | POA: Diagnosis not present

## 2015-10-10 DIAGNOSIS — G8929 Other chronic pain: Secondary | ICD-10-CM | POA: Insufficient documentation

## 2015-10-10 DIAGNOSIS — T502X5A Adverse effect of carbonic-anhydrase inhibitors, benzothiadiazides and other diuretics, initial encounter: Secondary | ICD-10-CM

## 2015-10-10 DIAGNOSIS — M25529 Pain in unspecified elbow: Secondary | ICD-10-CM

## 2015-10-10 MED ORDER — HYDROCODONE-ACETAMINOPHEN 10-325 MG PO TABS: 1.0000 | ORAL_TABLET | Freq: Four times a day (QID) | ORAL | Status: DC | PRN

## 2015-10-10 MED ORDER — ALPRAZOLAM 0.5 MG PO TABS: 0.5000 mg | ORAL_TABLET | Freq: Three times a day (TID) | ORAL | Status: DC | PRN

## 2015-10-10 NOTE — Patient Instructions (Signed)
Lateral Epicondylitis With Rehab Lateral epicondylitis involves inflammation and pain around the outer portion of the elbow. The pain is caused by inflammation of the tendons in the forearm that bring back (extend) the wrist. Lateral epicondylitis is also called tennis elbow, because it is very common in tennis players. However, it may occur in any individual who extends the wrist repetitively. If lateral epicondylitis is left untreated, it may become a chronic problem. SYMPTOMS   Pain, tenderness, and inflammation on the outer (lateral) side of the elbow.  Pain or weakness with gripping activities.  Pain that increases with wrist-twisting motions (playing tennis, using a screwdriver, opening a door or a jar).  Pain with lifting objects, including a coffee cup. CAUSES  Lateral epicondylitis is caused by inflammation of the tendons that extend the wrist. Causes of injury may include:  Repetitive stress and strain on the muscles and tendons that extend the wrist.  Sudden change in activity level or intensity.  Incorrect grip in racquet sports.  Incorrect grip size of racquet (often too large).  Incorrect hitting position or technique (usually backhand, leading with the elbow).  Using a racket that is too heavy. RISK INCREASES WITH:  Sports or occupations that require repetitive and/or strenuous forearm and wrist movements (tennis, squash, racquetball, carpentry).  Poor wrist and forearm strength and flexibility.  Failure to warm up properly before activity.  Resuming activity before healing, rehabilitation, and conditioning are complete. PREVENTION   Warm up and stretch properly before activity.  Maintain physical fitness:  Strength, flexibility, and endurance.  Cardiovascular fitness.  Wear and use properly fitted equipment.  Learn and use proper technique and have a coach correct improper technique.  Wear a tennis elbow (counterforce) brace. PROGNOSIS  The course of  this condition depends on the degree of the injury. If treated properly, acute cases (symptoms lasting less than 4 weeks) are often resolved in 2 to 6 weeks. Chronic (longer lasting cases) often resolve in 3 to 6 months but may require physical therapy. RELATED COMPLICATIONS   Frequently recurring symptoms, resulting in a chronic problem. Properly treating the problem the first time decreases frequency of recurrence.  Chronic inflammation, scarring tendon degeneration, and partial tendon tear, requiring surgery.  Delayed healing or resolution of symptoms. TREATMENT  Treatment first involves the use of ice and medicine to reduce pain and inflammation. Strengthening and stretching exercises may help reduce discomfort if performed regularly. These exercises may be performed at home if the condition is an acute injury. Chronic cases may require a referral to a physical therapist for evaluation and treatment. Your caregiver may advise a corticosteroid injection to help reduce inflammation. Rarely, surgery is needed. MEDICATION  If pain medicine is needed, nonsteroidal anti-inflammatory medicines (aspirin and ibuprofen), or other minor pain relievers (acetaminophen), are often advised.  Do not take pain medicine for 7 days before surgery.  Prescription pain relievers may be given, if your caregiver thinks they are needed. Use only as directed and only as much as you need.  Corticosteroid injections may be recommended. These injections should be reserved only for the most severe cases, because they can only be given a certain number of times. HEAT AND COLD  Cold treatment (icing) should be applied for 10 to 15 minutes every 2 to 3 hours for inflammation and pain, and immediately after activity that aggravates your symptoms. Use ice packs or an ice massage.  Heat treatment may be used before performing stretching and strengthening activities prescribed by your caregiver, physical therapist,   or  athletic trainer. Use a heat pack or a warm water soak. SEEK MEDICAL CARE IF: Symptoms get worse or do not improve in 2 weeks, despite treatment. EXERCISES  RANGE OF MOTION (ROM) AND STRETCHING EXERCISES - Epicondylitis, Lateral (Tennis Elbow) These exercises may help you when beginning to rehabilitate your injury. Your symptoms may go away with or without further involvement from your physician, physical therapist, or athletic trainer. While completing these exercises, remember:   Restoring tissue flexibility helps normal motion to return to the joints. This allows healthier, less painful movement and activity.  An effective stretch should be held for at least 30 seconds.  A stretch should never be painful. You should only feel a gentle lengthening or release in the stretched tissue. RANGE OF MOTION - Wrist Flexion, Active-Assisted  Extend your right / left elbow with your fingers pointing down.*  Gently pull the back of your hand towards you, until you feel a gentle stretch on the top of your forearm.  Hold this position for __________ seconds. Repeat __________ times. Complete this exercise __________ times per day.  *If directed by your physician, physical therapist or athletic trainer, complete this stretch with your elbow bent, rather than extended. RANGE OF MOTION - Wrist Extension, Active-Assisted  Extend your right / left elbow and turn your palm upwards.*  Gently pull your palm and fingertips back, so your wrist extends and your fingers point more toward the ground.  You should feel a gentle stretch on the inside of your forearm.  Hold this position for __________ seconds. Repeat __________ times. Complete this exercise __________ times per day. *If directed by your physician, physical therapist or athletic trainer, complete this stretch with your elbow bent, rather than extended. STRETCH - Wrist Flexion  Place the back of your right / left hand on a tabletop, leaving your  elbow slightly bent. Your fingers should point away from your body.  Gently press the back of your hand down onto the table by straightening your elbow. You should feel a stretch on the top of your forearm.  Hold this position for __________ seconds. Repeat __________ times. Complete this stretch __________ times per day.  STRETCH - Wrist Extension   Place your right / left fingertips on a tabletop, leaving your elbow slightly bent. Your fingers should point backwards.  Gently press your fingers and palm down onto the table by straightening your elbow. You should feel a stretch on the inside of your forearm.  Hold this position for __________ seconds. Repeat __________ times. Complete this stretch __________ times per day.  STRENGTHENING EXERCISES - Epicondylitis, Lateral (Tennis Elbow) These exercises may help you when beginning to rehabilitate your injury. They may resolve your symptoms with or without further involvement from your physician, physical therapist, or athletic trainer. While completing these exercises, remember:   Muscles can gain both the endurance and the strength needed for everyday activities through controlled exercises.  Complete these exercises as instructed by your physician, physical therapist or athletic trainer. Increase the resistance and repetitions only as guided.  You may experience muscle soreness or fatigue, but the pain or discomfort you are trying to eliminate should never worsen during these exercises. If this pain does get worse, stop and make sure you are following the directions exactly. If the pain is still present after adjustments, discontinue the exercise until you can discuss the trouble with your caregiver. STRENGTH - Wrist Flexors  Sit with your right / left forearm palm-up and fully supported   on a table or countertop. Your elbow should be resting below the height of your shoulder. Allow your wrist to extend over the edge of the  surface.  Loosely holding a __________ weight, or a piece of rubber exercise band or tubing, slowly curl your hand up toward your forearm.  Hold this position for __________ seconds. Slowly lower the wrist back to the starting position in a controlled manner. Repeat __________ times. Complete this exercise __________ times per day.  STRENGTH - Wrist Extensors  Sit with your right / left forearm palm-down and fully supported on a table or countertop. Your elbow should be resting below the height of your shoulder. Allow your wrist to extend over the edge of the surface.  Loosely holding a __________ weight, or a piece of rubber exercise band or tubing, slowly curl your hand up toward your forearm.  Hold this position for __________ seconds. Slowly lower the wrist back to the starting position in a controlled manner. Repeat __________ times. Complete this exercise __________ times per day.  STRENGTH - Ulnar Deviators  Stand with a ____________________ weight in your right / left hand, or sit while holding a rubber exercise band or tubing, with your healthy arm supported on a table or countertop.  Move your wrist, so that your pinkie travels toward your forearm and your thumb moves away from your forearm.  Hold this position for __________ seconds and then slowly lower the wrist back to the starting position. Repeat __________ times. Complete this exercise __________ times per day STRENGTH - Radial Deviators  Stand with a ____________________ weight in your right / left hand, or sit while holding a rubber exercise band or tubing, with your injured arm supported on a table or countertop.  Raise your hand upward in front of you or pull up on the rubber tubing.  Hold this position for __________ seconds and then slowly lower the wrist back to the starting position. Repeat __________ times. Complete this exercise __________ times per day. STRENGTH - Forearm Supinators   Sit with your right /  left forearm supported on a table, keeping your elbow below shoulder height. Rest your hand over the edge, palm down.  Gently grip a hammer or a soup ladle.  Without moving your elbow, slowly turn your palm and hand upward to a "thumbs-up" position.  Hold this position for __________ seconds. Slowly return to the starting position. Repeat __________ times. Complete this exercise __________ times per day.  STRENGTH - Forearm Pronators   Sit with your right / left forearm supported on a table, keeping your elbow below shoulder height. Rest your hand over the edge, palm up.  Gently grip a hammer or a soup ladle.  Without moving your elbow, slowly turn your palm and hand upward to a "thumbs-up" position.  Hold this position for __________ seconds. Slowly return to the starting position. Repeat __________ times. Complete this exercise __________ times per day.  STRENGTH - Grip  Grasp a tennis ball, a dense sponge, or a large, rolled sock in your hand.  Squeeze as hard as you can, without increasing any pain.  Hold this position for __________ seconds. Release your grip slowly. Repeat __________ times. Complete this exercise __________ times per day.  STRENGTH - Elbow Extensors, Isometric  Stand or sit upright, on a firm surface. Place your right / left arm so that your palm faces your stomach, and it is at the height of your waist.  Place your opposite hand on the underside   of your forearm. Gently push up as your right / left arm resists. Push as hard as you can with both arms, without causing any pain or movement at your right / left elbow. Hold this stationary position for __________ seconds. Gradually release the tension in both arms. Allow your muscles to relax completely before repeating.   This information is not intended to replace advice given to you by your health care provider. Make sure you discuss any questions you have with your health care provider.   Document Released:  12/06/2005 Document Revised: 12/27/2014 Document Reviewed: 03/20/2009 Elsevier Interactive Patient Education 2016 [employer] Furosemide tablets What is this medicine? FUROSEMIDE (fyoor [place] se mide) is a diuretic. It helps you make more urine and to lose salt and excess water from your body. This medicine is used to treat high blood pressure, and edema or swelling from heart, kidney, or liver disease. This medicine may be used for other purposes; ask your health care provider or pharmacist if you have questions. What should I tell my health care provider before I take this medicine? They need to know if you have any of these conditions: -abnormal blood electrolytes -diarrhea or vomiting -gout -heart disease -kidney disease, small amounts of urine, or difficulty passing urine -liver disease -thyroid disease -an unusual or allergic reaction to furosemide, sulfa drugs, other medicines, foods, dyes, or preservatives -pregnant or trying to get pregnant -breast-feeding How should I use this medicine? Take this medicine by mouth with a glass of water. Follow the directions on the prescription label. You may take this medicine with or without food. If it upsets your stomach, take it with food or milk. Do not take your medicine more often than directed. Remember that you will need to pass more urine after taking this medicine. Do not take your medicine at a time of day that will cause you problems. Do not take at bedtime. Talk to your pediatrician regarding the use of this medicine in children. While this drug may be prescribed for selected conditions, precautions do apply. Overdosage: If you think you have taken too much of this medicine contact a poison control center or emergency room at once. NOTE: This medicine is only for you. Do not share this medicine with others. What if I miss a dose? If you miss a dose, take it as soon as you can. If it is almost time for your next dose, take only that  dose. Do not take double or extra doses. What may interact with this medicine? -aspirin and aspirin-like medicines -certain antibiotics -chloral hydrate -cisplatin -cyclosporine -digoxin -diuretics -laxatives -lithium -medicines for blood pressure -medicines that relax muscles for surgery -methotrexate -NSAIDs, medicines for pain and inflammation like ibuprofen, naproxen, or indomethacin -phenytoin -steroid medicines like prednisone or cortisone -sucralfate -thyroid hormones This list may not describe all possible interactions. Give your health care provider a list of all the medicines, herbs, non-prescription drugs, or dietary supplements you use. Also tell them if you smoke, drink alcohol, or use illegal drugs. Some items may interact with your medicine. What should I watch for while using this medicine? Visit your doctor or health care professional for regular checks on your progress. Check your blood pressure regularly. Ask your doctor or health care professional what your blood pressure should be, and when you should contact him or her. If you are a diabetic, check your blood sugar as directed. You may need to be on a special diet while taking this medicine. Check with your  doctor. Also, ask how many glasses of fluid you need to drink a day. You must not get dehydrated. You may get drowsy or dizzy. Do not drive, use machinery, or do anything that needs mental alertness until you know how this drug affects you. Do not stand or sit up quickly, especially if you are an older patient. This reduces the risk of dizzy or fainting spells. Alcohol can make you more drowsy and dizzy. Avoid alcoholic drinks. This medicine can make you more sensitive to the sun. Keep out of the sun. If you cannot avoid being in the sun, wear protective clothing and use sunscreen. Do not use sun lamps or tanning beds/booths. What side effects may I notice from receiving this medicine? Side effects that you should  report to your doctor or health care professional as soon as possible: -blood in urine or stools -dry mouth -fever or chills -hearing loss or ringing in the ears -irregular heartbeat -muscle pain or weakness, cramps -skin rash -stomach upset, pain, or nausea -tingling or numbness in the hands or feet -unusually weak or tired -vomiting or diarrhea -yellowing of the eyes or skin Side effects that usually do not require medical attention (report to your doctor or health care professional if they continue or are bothersome): -headache -loss of appetite -unusual bleeding or bruising This list may not describe all possible side effects. Call your doctor for medical advice about side effects. You may report side effects to FDA at 1-800-FDA-1088. Where should I keep my medicine? Keep out of the reach of children. Store at room temperature between 15 and 30 degrees C (59 and 86 degrees F). Protect from light. Throw away any unused medicine after the expiration date. NOTE: This sheet is a summary. It may not cover all possible information. If you have questions about this medicine, talk to your doctor, pharmacist, or health care provider.    2016, Elsevier/Gold Standard. (2015-02-26 13:49:50)

## 2015-10-10 NOTE — Progress Notes (Signed)
Patient: Jessica Elliott Female    DOB: [birth year]   59 y.o.   MRN: [id number] Visit Date: 10/10/2015  Today's Provider: Mar Daring, PA-C   Chief Complaint  Patient presents with  . Follow-up    Bilateral Leg Edema   Subjective:    HPI Jessica Elliott is a 59 year old female that returns to the office today for follow-up of her bilateral lower extremity edema. Patient states aching in the calf muscle and numbness and tingling in her feet. The numbness and tingling has been unchanged since previous visit. She does have a follow-up with her endocrinologist closer to the end of the year.  The muscle cramping that she mentioned she states only happened 1 day. She said that it was like a soreness and tightness in the left calf muscle. It only lasted for 1 day and has not happened since. She does state that the furosemide has helped her lower extremity edema tremendously as well as the leg pain that she was happened. She also states that she has not had as many episodes of orthostatic hypotension as she had had before as well.  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 This only flares every once in a while. She states that she may have to take ibuprofen once or twice a week. On days where he gets really bad and ibuprofen does not help and that is when she will take the hydrocodone-acetaminophen 10-325.  She also is asking for refill of her Xanax. She takes this as needed to help her sleep.    Allergies  Allergen Reactions  . Ibuprofen Other (See Comments)    Drowsy  . Statins Other (See Comments)  . Codeine Hives  . Penicillins Hives   Previous Medications   ALBUTEROL (PROVENTIL HFA;VENTOLIN HFA) 108 (90 BASE) MCG/ACT INHALER    Inhale 2 puffs into the lungs every 6 (six) hours as needed.   ALPRAZOLAM (XANAX) 0.5 MG TABLET    Take 1 tablet (0.5 mg total) by mouth every 8 (eight) hours as needed.   ASPIRIN 81 MG  TABLET    Take 81 mg by mouth daily.   BACLOFEN (LIORESAL) 10 MG TABLET    Take by mouth.   DAPAGLIFLOZIN PROPANEDIOL (FARXIGA) 5 MG TABS TABLET    1 tablet daily.   FUROSEMIDE (LASIX) 20 MG TABLET    TAKE ONE TABLET BY MOUTH EVERY DAY   HYDROCODONE-ACETAMINOPHEN (NORCO) 10-325 MG PER TABLET    Take 1 tablet by mouth every 6 (six) hours as needed.   IBUPROFEN (ADVIL,MOTRIN) 800 MG TABLET    TAKE ONE TABLET EVERY EIGHT HOURS AS NEEDED   INSULIN ASPART (NOVOLOG) 100 UNIT/ML INJECTION    Inject into the skin. 16-18-20 three times daily   INSULIN GLARGINE (LANTUS) 100 UNIT/ML INJECTION    Inject 46 Units into the skin at bedtime.   INSULIN PEN NEEDLE (B-D ULTRAFINE III SHORT PEN) 31G X 8 MM MISC    Inject insulin 4 times daily   LANTUS SOLOSTAR 100 UNIT/ML SOLOSTAR PEN       LORATADINE (CLARITIN) 10 MG TABLET    Take 1 tablet by mouth daily.   LOVASTATIN (MEVACOR) 40 MG TABLET    TAKE ONE TABLET AT BEDTIME   METFORMIN (GLUCOPHAGE) 1000 MG TABLET    Take 1000 mg in am and 1500 mg at bedtime   ONETOUCH VERIO TEST STRIP       OXYCODONE-ACETAMINOPHEN (  PERCOCET) 10-325 MG TABLET       VITAMIN D, ERGOCALCIFEROL, (DRISDOL) 50000 UNITS CAPS CAPSULE    TAKE 1 CAPSULE ONCE A MONTH    Review of Systems  Constitutional: Negative.   HENT: Negative.   Respiratory: Negative.   Cardiovascular: Positive for leg swelling (if patient doesn't take the medicine. ). Negative for chest pain and palpitations.  Gastrointestinal: Negative.   Endocrine: Negative.   Musculoskeletal: Positive for myalgias (left calf muscle) and arthralgias (right elbow). Negative for neck pain and neck stiffness.  Skin: Positive for color change (tops of feet and lower legs bilaterally.  Was told it was new and old petechiae from ASA use by a dermatologist).  Allergic/Immunologic: Negative.   Neurological: Positive for numbness.  Hematological: Negative.   Psychiatric/Behavioral: Negative.     Social History  Substance Use Topics    . Smoking status: Former Research scientist (life sciences)  . Smokeless tobacco: Never Used  . Alcohol Use: 0.0 oz/week    0 Standard drinks or equivalent per week     Comment: occasionally   Objective:   BP 124/70 mmHg  Pulse 78  Temp(Src) 97.4 F (36.3 C) (Oral)  Resp 16  Wt 199 lb (90.266 kg)  Physical Exam  Constitutional: She appears well-developed and well-nourished. No distress.  Cardiovascular: Normal rate, regular rhythm and normal heart sounds.  Exam reveals no gallop and no friction rub.   No murmur heard. Pulmonary/Chest: Effort normal and breath sounds normal. No respiratory distress. She has no wheezes. She has no rales.  Musculoskeletal: She exhibits edema (trace edema bilateral lower extremities).       Right elbow: She exhibits normal range of motion, no swelling, no effusion, no deformity and no laceration. Tenderness found. Lateral epicondyle tenderness noted.       Left elbow: Normal.  Skin: She is not diaphoretic.  Vitals reviewed.       Assessment & Plan:     1. Myalgia I will check labs to make sure that her potassium is not getting too low from the furosemide. I will follow-up with her pending lab results. If labs are stable I will follow-up with her in 6 months to recheck. - Comprehensive Metabolic Panel (CMET) - CBC with Differential  2. Bilateral leg edema Improving. She is to continue furosemide 20 mg daily. I will follow-up with her in 6 months to see how she is doing.  3. Anxiety Stable. She uses Xanax to help her sleep as needed. I will refill her Xanax as below. I will follow-up with her in 6 months. She may call the office if she has any worsening symptoms. - ALPRAZolam (XANAX) 0.5 MG tablet; Take 1 tablet (0.5 mg total) by mouth every 8 (eight) hours as needed.  Dispense: 30 tablet; Refill: 1  4. Elbow pain, chronic, right Previously had gotten Percocet. That was too strong. He states that she had severe nausea with taking that medication. She has not taken  anything recently. She states that she only takes narcotic pain medication for her elbow when ibuprofen fails. I will refill her Norco as below. I will follow-up with her in 6 months see how she is doing.  She may call the office if symptoms worsen in the meantime. - HYDROcodone-acetaminophen (NORCO) 10-325 MG tablet; Take 1 tablet by mouth every 6 (six) hours as needed.  Dispense: 30 tablet; Refill: 0       Mar Daring, PA-C  [facility]

## 2015-10-11 LAB — CBC WITH DIFFERENTIAL/PLATELET
BASOS: 0 %
Basophils Absolute: 0 10*3/uL (ref 0.0–0.2)
EOS (ABSOLUTE): 0.1 10*3/uL (ref 0.0–0.4)
EOS: 2 %
HEMATOCRIT: 39.6 % (ref 34.0–46.6)
Hemoglobin: 13.1 g/dL (ref 11.1–15.9)
IMMATURE GRANS (ABS): 0 10*3/uL (ref 0.0–0.1)
IMMATURE GRANULOCYTES: 1 %
LYMPHS: 37 %
Lymphocytes Absolute: 2.3 10*3/uL (ref 0.7–3.1)
MCH: 25.9 pg — AB (ref 26.6–33.0)
MCHC: 33.1 g/dL (ref 31.5–35.7)
MCV: 78 fL — AB (ref 79–97)
Monocytes Absolute: 0.7 10*3/uL (ref 0.1–0.9)
Monocytes: 11 %
NEUTROS ABS: 3.1 10*3/uL (ref 1.4–7.0)
NEUTROS PCT: 49 %
PLATELETS: 215 10*3/uL (ref 150–379)
RBC: 5.06 x10E6/uL (ref 3.77–5.28)
RDW: 14.7 % (ref 12.3–15.4)
WBC: 6.3 10*3/uL (ref 3.4–10.8)

## 2015-10-11 LAB — COMPREHENSIVE METABOLIC PANEL
A/G RATIO: 1.6 (ref 1.1–2.5)
ALT: 24 IU/L (ref 0–32)
AST: 19 IU/L (ref 0–40)
Albumin: 4.7 g/dL (ref 3.5–5.5)
Alkaline Phosphatase: 58 IU/L (ref 39–117)
BUN/Creatinine Ratio: 17 (ref 9–23)
BUN: 12 mg/dL (ref 6–24)
Bilirubin Total: 0.3 mg/dL (ref 0.0–1.2)
CO2: 28 mmol/L (ref 18–29)
CREATININE: 0.69 mg/dL (ref 0.57–1.00)
Calcium: 9.9 mg/dL (ref 8.7–10.2)
Chloride: 98 mmol/L (ref 97–106)
GFR, EST AFRICAN AMERICAN: 112 mL/min/{1.73_m2} (ref >59)
GFR, EST NON AFRICAN AMERICAN: 97 mL/min/{1.73_m2} (ref >59)
GLUCOSE: 223 mg/dL — AB (ref 65–99)
Globulin, Total: 2.9 g/dL (ref 1.5–4.5)
Potassium: 3.7 mmol/L (ref 3.5–5.2)
Sodium: 141 mmol/L (ref 136–144)
TOTAL PROTEIN: 7.6 g/dL (ref 6.0–8.5)

## 2015-10-11 MED ORDER — POTASSIUM CHLORIDE ER 10 MEQ PO TBCR: 10.0000 meq | EXTENDED_RELEASE_TABLET | Freq: Every day | ORAL | Status: DC

## 2015-10-11 NOTE — Addendum Note (Signed)
Addended by: Mar Daring on: 10/11/2015 09:19 AM   Modules accepted: Orders

## 2015-10-14 ENCOUNTER — Telehealth: Payer: Self-pay

## 2015-10-14 NOTE — Telephone Encounter (Signed)
Patient advised as directed below. Potassium supplement was sent on 10/11/15  Thanks,  -Joseline

## 2015-10-14 NOTE — Telephone Encounter (Signed)
LMTCB 10/14/2015  Thanks,  -Joseline

## 2015-10-14 NOTE — Telephone Encounter (Signed)
-----   Message from Mar Daring, PA-C sent at 10/11/2015  9:17 AM EDT ----- Labs are stable and WNL.  Glucose was elevated at 223 but this was a non-fasting lab.  Your potassium is still WNL but has decreased from 4.5 to 3.7 from 3 months ago.  I think we should go ahead and add a potassium supplement just to protect it from getting lower since you are now taking your furosemide regularly.  I will send in potassium prescription to your pharmacy.

## 2015-10-15 ENCOUNTER — Telehealth: Payer: Self-pay

## 2015-10-15 NOTE — Telephone Encounter (Signed)
Patient is requesting lab results from 07/09/2015 be faxed to [place] at [facility]. Contact number for [facility] is [phone]. Patient is unsure of fax #.

## 2015-10-16 ENCOUNTER — Other Ambulatory Visit: Payer: Self-pay | Admitting: Physician Assistant

## 2015-10-16 DIAGNOSIS — Z1231 Encounter for screening mammogram for malignant neoplasm of breast: Secondary | ICD-10-CM

## 2015-10-17 NOTE — Telephone Encounter (Signed)
[facility] to get fax #.  Labs have been fax to attention Dr. Gabriel Carina  Thanks,  -Olney Monier

## 2015-10-21 ENCOUNTER — Ambulatory Visit
Admission: RE | Admit: 2015-10-21 | Discharge: 2015-10-21 | Disposition: A | Discharge status: Home / Self Care | Payer: BLUE CROSS/BLUE SHIELD | Source: Ambulatory Visit | Attending: Physician Assistant | Admitting: Physician Assistant

## 2015-10-21 DIAGNOSIS — Z1231 Encounter for screening mammogram for malignant neoplasm of breast: Secondary | ICD-10-CM | POA: Diagnosis present

## 2015-10-24 ENCOUNTER — Telehealth: Payer: Self-pay

## 2015-10-24 NOTE — Telephone Encounter (Signed)
LMTCB  Thanks,  -Joseline 

## 2015-10-24 NOTE — Telephone Encounter (Signed)
Patient advised as directed below.  Thanks,  -Joseline 

## 2015-10-24 NOTE — Telephone Encounter (Signed)
-----   Message from Mar Daring, [place] sent at 10/23/2015  3:20 PM EDT ----- Normal mammogram.

## 2015-12-12 ENCOUNTER — Other Ambulatory Visit: Payer: Self-pay | Admitting: Physician Assistant

## 2015-12-12 DIAGNOSIS — F419 Anxiety disorder, unspecified: Secondary | ICD-10-CM

## 2015-12-16 NOTE — Telephone Encounter (Signed)
Prescription for Alprazolam was called to [facility].  Thanks,  -Desaray Marschner

## 2015-12-31 ENCOUNTER — Other Ambulatory Visit: Payer: Self-pay | Admitting: Family Medicine

## 2016-01-02 ENCOUNTER — Ambulatory Visit (INDEPENDENT_AMBULATORY_CARE_PROVIDER_SITE_OTHER): Payer: BLUE CROSS/BLUE SHIELD | Admitting: Physician Assistant

## 2016-01-02 ENCOUNTER — Encounter: Payer: Self-pay | Admitting: Physician Assistant

## 2016-01-02 VITALS — BP 120/60 | HR 93 | Temp 98.1°F | Resp 16 | Wt 202.8 lb

## 2016-01-02 DIAGNOSIS — R109 Unspecified abdominal pain: Secondary | ICD-10-CM

## 2016-01-02 DIAGNOSIS — J309 Allergic rhinitis, unspecified: Secondary | ICD-10-CM | POA: Diagnosis not present

## 2016-01-02 DIAGNOSIS — M25521 Pain in right elbow: Secondary | ICD-10-CM

## 2016-01-02 DIAGNOSIS — G8929 Other chronic pain: Secondary | ICD-10-CM | POA: Diagnosis not present

## 2016-01-02 LAB — POCT URINALYSIS DIPSTICK
Bilirubin, UA: NEGATIVE
Blood, UA: NEGATIVE
Glucose, UA: 2000
KETONES UA: NEGATIVE
Leukocytes, UA: NEGATIVE
Nitrite, UA: NEGATIVE
PROTEIN UA: NEGATIVE
SPEC GRAV UA: 1.01
Urobilinogen, UA: 4
pH, UA: 7

## 2016-01-02 MED ORDER — HYDROCODONE-ACETAMINOPHEN 10-325 MG PO TABS: 1.0000 | ORAL_TABLET | Freq: Four times a day (QID) | ORAL | Status: DC | PRN

## 2016-01-02 MED ORDER — FLUTICASONE PROPIONATE 50 MCG/ACT NA SUSP: 2.0000 | Freq: Every day | NASAL | Status: DC

## 2016-01-02 NOTE — Progress Notes (Signed)
Patient: Jessica Elliott Female    DOB: [birth year]   60 y.o.   MRN: [id number] Visit Date: 01/02/2016  Today's Provider: Mar Daring, PA-C   Chief Complaint  Patient presents with  . Back Pain   Subjective:    Back Pain This is a new problem. The current episode started in the past 7 days. The problem occurs intermittently (comes and go). The problem is unchanged. The quality of the pain is described as aching. The pain does not radiate. The pain is at a severity of 3/10. The pain is the same all the time. Pertinent negatives include no abdominal pain, chest pain, dysuria, fever, leg pain, numbness, pelvic pain, tingling or weakness. Treatments tried: Increase Fluids.  She reports a dull right flank pain. She states that she feels it may be a kidney stone but she wanted to make sure that she did not have a kidney infection for having a long weekend. She does have a history of both urinary tract infections as well as kidney stones.  Patient reports getting her Influenza vaccine at work 09/2015.     Allergies  Allergen Reactions  . Ibuprofen Other (See Comments)    Drowsy  . Statins Other (See Comments)  . Codeine Hives  . Penicillins Hives   Previous Medications   ALBUTEROL (PROVENTIL HFA;VENTOLIN HFA) 108 (90 BASE) MCG/ACT INHALER    Inhale 2 puffs into the lungs every 6 (six) hours as needed.   ALPRAZOLAM (XANAX) 0.5 MG TABLET    TAKE ONE TABLET BY MOUTH EVERY 8 HOURS AS NEEDED   ASPIRIN 81 MG TABLET    Take 81 mg by mouth daily.   B-D ULTRAFINE III SHORT PEN 31G X 8 MM MISC    INJECT INSULIN 4 TIMES A DAY   DAPAGLIFLOZIN PROPANEDIOL (FARXIGA) 5 MG TABS TABLET    1 tablet daily.   FUROSEMIDE (LASIX) 20 MG TABLET    TAKE ONE TABLET BY MOUTH EVERY DAY   HYDROCODONE-ACETAMINOPHEN (NORCO) 10-325 MG TABLET    Take 1 tablet by mouth every 6 (six) hours as needed.   IBUPROFEN (ADVIL,MOTRIN) 800 MG TABLET    TAKE ONE TABLET EVERY EIGHT HOURS AS NEEDED   INSULIN ASPART  (NOVOLOG) 100 UNIT/ML INJECTION    Inject into the skin. 16-18-20 three times daily   LORATADINE (CLARITIN) 10 MG TABLET    Take 1 tablet by mouth daily. Reported on 01/02/2016   LOVASTATIN (MEVACOR) 40 MG TABLET    TAKE ONE TABLET AT BEDTIME   METFORMIN (GLUCOPHAGE) 1000 MG TABLET    Take 1000 mg in am and 1500 mg at bedtime   ONETOUCH VERIO TEST STRIP       POTASSIUM CHLORIDE (K-DUR) 10 MEQ TABLET    Take 1 tablet (10 mEq total) by mouth daily.   TOUJEO SOLOSTAR 300 UNIT/ML SOPN       VITAMIN D, ERGOCALCIFEROL, (DRISDOL) 50000 UNITS CAPS CAPSULE    TAKE 1 CAPSULE ONCE A MONTH    Review of Systems  Constitutional: Negative for fever and chills.  HENT: Positive for rhinorrhea and sneezing.   Respiratory: Negative.   Cardiovascular: Negative for chest pain and leg swelling.  Gastrointestinal: Negative for nausea, vomiting, abdominal pain and constipation.  Genitourinary: Positive for flank pain (right). Negative for dysuria, urgency, frequency, hematuria and pelvic pain.  Neurological: Negative for dizziness, tingling, weakness and numbness.  All other systems reviewed and are negative.   Social History  Substance Use Topics  .  Smoking status: Former Research scientist (life sciences)  . Smokeless tobacco: Never Used  . Alcohol Use: 0.0 oz/week    0 Standard drinks or equivalent per week     Comment: occasionally   Objective:   BP 120/60 mmHg  Pulse 93  Temp(Src) 98.1 F (36.7 C) (Oral)  Resp 16  Wt 202 lb 12.8 oz (91.989 kg)  SpO2 97%  Physical Exam  Constitutional: She is oriented to person, place, and time. She appears well-developed and well-nourished. No distress.  HENT:  Head: Normocephalic and atraumatic.  Right Ear: Tympanic membrane, external ear and ear canal normal.  Left Ear: Tympanic membrane, external ear and ear canal normal.  Nose: Mucosal edema and rhinorrhea present. Right sinus exhibits no maxillary sinus tenderness and no frontal sinus tenderness. Left sinus exhibits no maxillary  sinus tenderness and no frontal sinus tenderness.  Mouth/Throat: Uvula is midline, oropharynx is clear and moist and mucous membranes are normal. No oropharyngeal exudate, posterior oropharyngeal edema or posterior oropharyngeal erythema.  Cardiovascular: Normal rate, regular rhythm and normal heart sounds.  Exam reveals no gallop and no friction rub.   No murmur heard. Pulmonary/Chest: Effort normal and breath sounds normal. No respiratory distress. She has no wheezes. She has no rales.  Abdominal: Soft. Normal appearance and bowel sounds are normal. She exhibits no distension and no mass. There is no hepatosplenomegaly. There is no tenderness. There is no rebound, no guarding and no CVA tenderness.  Neurological: She is alert and oriented to person, place, and time.  Skin: Skin is warm and dry. She is not diaphoretic.  Vitals reviewed.       Assessment & Plan:     1. Flank pain UA was negative for leukocytes, nitrites, protein, ketones, blood. She does have glucose in her urine however which is common for her secondary to her diabetic medications. I do feel this may possibly be a kidney stone as she says it does feel similar to her previous kidney stones she just has not felt it move yet. I will refill her pain medication as below in case she develops more pain over the weekend secondary to what is believed to be a kidney stone. I did also advise her that she needs to make sure to push fluids. We also discussed that if she develops an inability to void or worsening pain, fever, nausea or vomiting that she needs to go to the hospital for further evaluation. She voiced understanding and agrees to do so. - POCT Urinalysis Dipstick - HYDROcodone-acetaminophen (NORCO) 10-325 MG tablet; Take 1 tablet by mouth every 6 (six) hours as needed.  Dispense: 30 tablet; Refill: 0  2. Allergic rhinitis, unspecified allergic rhinitis type She does report increased rhinorrhea and discomfort in her nasal  passageways. She does have mucosal edema on exam today. I will prescribe Flonase as below for this. She is to call the office if symptoms fail to improve or worsen. - fluticasone (FLONASE) 50 MCG/ACT nasal spray; Place 2 sprays into both nostrils daily.  Dispense: 16 g; Refill: [place], PA-C  [facility]

## 2016-01-28 ENCOUNTER — Other Ambulatory Visit: Payer: Self-pay | Admitting: Family Medicine

## 2016-01-28 DIAGNOSIS — E7800 Pure hypercholesterolemia, unspecified: Secondary | ICD-10-CM

## 2016-03-06 ENCOUNTER — Other Ambulatory Visit: Payer: Self-pay | Admitting: Physician Assistant

## 2016-03-06 DIAGNOSIS — F419 Anxiety disorder, unspecified: Secondary | ICD-10-CM

## 2016-03-08 ENCOUNTER — Encounter: Payer: Self-pay | Admitting: Physician Assistant

## 2016-03-08 ENCOUNTER — Ambulatory Visit (INDEPENDENT_AMBULATORY_CARE_PROVIDER_SITE_OTHER): Payer: BLUE CROSS/BLUE SHIELD | Admitting: Physician Assistant

## 2016-03-08 VITALS — BP 128/70 | HR 100 | Temp 98.1°F | Resp 16 | Wt 191.6 lb

## 2016-03-08 DIAGNOSIS — R52 Pain, unspecified: Secondary | ICD-10-CM

## 2016-03-08 DIAGNOSIS — J069 Acute upper respiratory infection, unspecified: Secondary | ICD-10-CM

## 2016-03-08 DIAGNOSIS — J029 Acute pharyngitis, unspecified: Secondary | ICD-10-CM | POA: Diagnosis not present

## 2016-03-08 LAB — POCT INFLUENZA A/B
Influenza A, POC: NEGATIVE
Influenza B, POC: NEGATIVE

## 2016-03-08 LAB — POCT RAPID STREP A (OFFICE): Rapid Strep A Screen: NEGATIVE

## 2016-03-08 NOTE — Patient Instructions (Signed)
Upper Respiratory Infection, Adult Most upper respiratory infections (URIs) are a viral infection of the air passages leading to the lungs. A URI affects the nose, throat, and upper air passages. The most common type of URI is nasopharyngitis and is typically referred to as "the common cold." URIs run their course and usually go away on their own. Most of the time, a URI does not require medical attention, but sometimes a bacterial infection in the upper airways can follow a viral infection. This is called a secondary infection. Sinus and middle ear infections are common types of secondary upper respiratory infections. Bacterial pneumonia can also complicate a URI. A URI can worsen asthma and chronic obstructive pulmonary disease (COPD). Sometimes, these complications can require emergency medical care and may be life threatening.  CAUSES Almost all URIs are caused by viruses. A virus is a type of germ and can spread from one person to another.  RISKS FACTORS You may be at risk for a URI if:   You smoke.   You have chronic heart or lung disease.  You have a weakened defense (immune) system.   You are very young or very old.   You have nasal allergies or asthma.  You work in crowded or poorly ventilated areas.  You work in health care facilities or schools. SIGNS AND SYMPTOMS  Symptoms typically develop 2-3 days after you come in contact with a cold virus. Most viral URIs last 7-10 days. However, viral URIs from the influenza virus (flu virus) can last 14-18 days and are typically more severe. Symptoms may include:   Runny or stuffy (congested) nose.   Sneezing.   Cough.   Sore throat.   Headache.   Fatigue.   Fever.   Loss of appetite.   Pain in your forehead, behind your eyes, and over your cheekbones (sinus pain).  Muscle aches.  DIAGNOSIS  Your health care provider may diagnose a URI by:  Physical exam.  Tests to check that your symptoms are not due to  another condition such as:  Strep throat.  Sinusitis.  Pneumonia.  Asthma. TREATMENT  A URI goes away on its own with time. It cannot be cured with medicines, but medicines may be prescribed or recommended to relieve symptoms. Medicines may help:  Reduce your fever.  Reduce your cough.  Relieve nasal congestion. HOME CARE INSTRUCTIONS   Take medicines only as directed by your health care provider.   Gargle warm saltwater or take cough drops to comfort your throat as directed by your health care provider.  Use a warm mist humidifier or inhale steam from a shower to increase air moisture. This may make it easier to breathe.  Drink enough fluid to keep your urine clear or pale yellow.   Eat soups and other clear broths and maintain good nutrition.   Rest as needed.   Return to work when your temperature has returned to normal or as your health care provider advises. You may need to stay home longer to avoid infecting others. You can also use a face mask and careful hand washing to prevent spread of the virus.  Increase the usage of your inhaler if you have asthma.   Do not use any tobacco products, including cigarettes, chewing tobacco, or electronic cigarettes. If you need help quitting, ask your health care provider. PREVENTION  The best way to protect yourself from getting a cold is to practice good hygiene.   Avoid oral or hand contact with people with cold   symptoms.   Wash your hands often if contact occurs.  There is no clear evidence that vitamin C, vitamin E, echinacea, or exercise reduces the chance of developing a cold. However, it is always recommended to get plenty of rest, exercise, and practice good nutrition.  SEEK MEDICAL CARE IF:   You are getting worse rather than better.   Your symptoms are not controlled by medicine.   You have chills.  You have worsening shortness of breath.  You have brown or red mucus.  You have yellow or brown nasal  discharge.  You have pain in your face, especially when you bend forward.  You have a fever.  You have swollen neck glands.  You have pain while swallowing.  You have white areas in the back of your throat. SEEK IMMEDIATE MEDICAL CARE IF:   You have severe or persistent:  Headache.  Ear pain.  Sinus pain.  Chest pain.  You have chronic lung disease and any of the following:  Wheezing.  Prolonged cough.  Coughing up blood.  A change in your usual mucus.  You have a stiff neck.  You have changes in your:  Vision.  Hearing.  Thinking.  Mood. MAKE SURE YOU:   Understand these instructions.  Will watch your condition.  Will get help right away if you are not doing well or get worse.   This information is not intended to replace advice given to you by your health care provider. Make sure you discuss any questions you have with your health care provider.   Document Released: 06/01/2001 Document Revised: 04/22/2015 Document Reviewed: 03/13/2014 Elsevier Interactive Patient Education 2016 Elsevier Inc.  

## 2016-03-08 NOTE — Progress Notes (Signed)
Patient: Jessica Elliott Female    DOB: 29-Mar-1956   60 y.o.   MRN: [id number] Visit Date: 03/08/2016  Today's Provider: Mar Daring, PA-C   Chief Complaint  Patient presents with  . URI   Subjective:    URI  This is a new problem. The current episode started in the past 7 days (Friday night). The problem has been gradually worsening. The maximum temperature recorded prior to her arrival was 102 - 102.9 F (101.2 on Saturday and yesterday 100.0). The fever has been present for 3 to 4 days. Associated symptoms include congestion, coughing, ear pain, rhinorrhea, a sore throat and wheezing. Pertinent negatives include no abdominal pain, chest pain, diarrhea, headaches, plugged ear sensation, rash, sinus pain, sneezing or vomiting. Associated symptoms comments: Body aches. She has tried decongestant for the symptoms. The treatment provided mild relief.       Allergies  Allergen Reactions  . Ibuprofen Other (See Comments)    Drowsy  . Statins Other (See Comments)  . Codeine Hives  . Penicillins Hives   Previous Medications   ALBUTEROL (PROVENTIL HFA;VENTOLIN HFA) 108 (90 BASE) MCG/ACT INHALER    Inhale 2 puffs into the lungs every 6 (six) hours as needed.   ALPRAZOLAM (XANAX) 0.5 MG TABLET    TAKE ONE TABLET EVERY EIGHT HOURS AS NEEDED   ASPIRIN 81 MG TABLET    Take 81 mg by mouth daily.   B-D ULTRAFINE III SHORT PEN 31G X 8 MM MISC    INJECT INSULIN 4 TIMES A DAY   FLUTICASONE (FLONASE) 50 MCG/ACT NASAL SPRAY    Place 2 sprays into both nostrils daily.   FUROSEMIDE (LASIX) 20 MG TABLET    TAKE ONE TABLET BY MOUTH EVERY DAY   HYDROCODONE-ACETAMINOPHEN (NORCO) 10-325 MG TABLET    Take 1 tablet by mouth every 6 (six) hours as needed.   IBUPROFEN (ADVIL,MOTRIN) 800 MG TABLET    TAKE ONE TABLET EVERY EIGHT HOURS AS NEEDED   INSULIN ASPART (NOVOLOG) 100 UNIT/ML INJECTION    Inject into the skin. 16-18-20 three times daily   LISINOPRIL (PRINIVIL,ZESTRIL) 5 MG TABLET       LORATADINE (CLARITIN) 10 MG TABLET    Take 1 tablet by mouth daily. Reported on 01/02/2016   LOVASTATIN (MEVACOR) 40 MG TABLET    TAKE ONE TABLET AT BEDTIME   METFORMIN (GLUCOPHAGE) 1000 MG TABLET    Take 1000 mg in am and 1500 mg at bedtime   NORTRIPTYLINE (PAMELOR) 10 MG CAPSULE       ONETOUCH VERIO TEST STRIP       PHENTERMINE 37.5 MG CAPSULE    Take by mouth.   POTASSIUM CHLORIDE (K-DUR) 10 MEQ TABLET    Take 1 tablet (10 mEq total) by mouth daily.   TOUJEO SOLOSTAR 300 UNIT/ML SOPN       VITAMIN D, ERGOCALCIFEROL, (DRISDOL) 50000 UNITS CAPS CAPSULE    TAKE 1 CAPSULE ONCE A MONTH    Review of Systems  Constitutional: Positive for fever and fatigue.  HENT: Positive for congestion, ear pain, rhinorrhea and sore throat. Negative for postnasal drip, sinus pressure, sneezing, trouble swallowing and voice change.   Eyes: Negative.   Respiratory: Positive for cough and wheezing. Negative for chest tightness and shortness of breath.   Cardiovascular: Negative for chest pain, palpitations and leg swelling.  Gastrointestinal: Negative for vomiting, abdominal pain and diarrhea.  Skin: Negative for rash.  Neurological: Negative for dizziness and headaches.  Social History  Substance Use Topics  . Smoking status: Former Research scientist (life sciences)  . Smokeless tobacco: Never Used  . Alcohol Use: 0.0 oz/week    0 Standard drinks or equivalent per week     Comment: occasionally   Objective:   BP 128/70 mmHg  Pulse 100  Temp(Src) 98.1 F (36.7 C) (Oral)  Resp 16  Wt 191 lb 9.6 oz (86.909 kg)  SpO2 98%  Physical Exam  Constitutional: She appears well-developed and well-nourished. No distress.  HENT:  Head: Normocephalic and atraumatic.  Right Ear: Hearing, tympanic membrane, external ear and ear canal normal.  Left Ear: Hearing, tympanic membrane, external ear and ear canal normal.  Nose: Mucosal edema and rhinorrhea present. Right sinus exhibits no maxillary sinus tenderness and no frontal sinus  tenderness. Left sinus exhibits no maxillary sinus tenderness and no frontal sinus tenderness.  Mouth/Throat: Uvula is midline, oropharynx is clear and moist and mucous membranes are normal. No oropharyngeal exudate, posterior oropharyngeal edema or posterior oropharyngeal erythema.  Eyes: Conjunctivae are normal. Pupils are equal, round, and reactive to light. Right eye exhibits no discharge. Left eye exhibits no discharge. No scleral icterus.  Neck: Normal range of motion. Neck supple. No tracheal deviation present. No thyromegaly present.  Cardiovascular: Normal rate, regular rhythm and normal heart sounds.  Exam reveals no gallop and no friction rub.   No murmur heard. Pulmonary/Chest: Effort normal and breath sounds normal. No stridor. No respiratory distress. She has no wheezes. She has no rales.  Lymphadenopathy:    She has no cervical adenopathy.  Skin: Skin is warm and dry. She is not diaphoretic.  Vitals reviewed.       Assessment & Plan:     1. Upper respiratory infection Symptoms are improving. Continue symptomatic relief. Try to stay well-hydrated and get plenty of rest. Call the office if symptoms fail to improve or worsen.  2. Sore throat Rapid strep is negative. - POCT rapid strep A  3. Body aches Flu test was negative. - POCT Influenza A/B       Mar Daring, PA-C  [facility]

## 2016-03-08 NOTE — Telephone Encounter (Signed)
Rx for Alprazolam called to [facility].  Thanks,  -Corry Ihnen

## 2016-03-15 ENCOUNTER — Telehealth: Payer: Self-pay | Admitting: Physician Assistant

## 2016-03-15 DIAGNOSIS — K1379 Other lesions of oral mucosa: Secondary | ICD-10-CM

## 2016-03-15 MED ORDER — MAGIC MOUTHWASH W/LIDOCAINE: 5.0000 mL | Freq: Three times a day (TID) | ORAL | Status: DC | PRN

## 2016-03-15 NOTE — Telephone Encounter (Signed)
Prescription called to [facility]. Thanks,  -Joseline

## 2016-03-15 NOTE — Telephone Encounter (Signed)
Pt states she seen you last week for having cough and sore throat.  Pt states she is still having soreness and burning in her mouth.  Pt is requesting a Rx for this. [facility].  CB#[phone]/MW

## 2016-03-15 NOTE — Telephone Encounter (Signed)
Please advise.  Thanks,  -Joseline 

## 2016-04-09 ENCOUNTER — Ambulatory Visit (INDEPENDENT_AMBULATORY_CARE_PROVIDER_SITE_OTHER): Payer: BLUE CROSS/BLUE SHIELD | Admitting: Physician Assistant

## 2016-04-09 ENCOUNTER — Encounter: Payer: Self-pay | Admitting: Physician Assistant

## 2016-04-09 VITALS — BP 118/68 | HR 92 | Temp 97.8°F | Resp 16 | Wt 188.8 lb

## 2016-04-09 DIAGNOSIS — M791 Myalgia, unspecified site: Secondary | ICD-10-CM

## 2016-04-09 DIAGNOSIS — G4700 Insomnia, unspecified: Secondary | ICD-10-CM

## 2016-04-09 DIAGNOSIS — F419 Anxiety disorder, unspecified: Secondary | ICD-10-CM

## 2016-04-09 DIAGNOSIS — G8929 Other chronic pain: Secondary | ICD-10-CM | POA: Diagnosis not present

## 2016-04-09 DIAGNOSIS — M25521 Pain in right elbow: Secondary | ICD-10-CM | POA: Diagnosis not present

## 2016-04-09 DIAGNOSIS — R600 Localized edema: Secondary | ICD-10-CM | POA: Diagnosis not present

## 2016-04-09 MED ORDER — HYDROCODONE-ACETAMINOPHEN 10-325 MG PO TABS: 1.0000 | ORAL_TABLET | Freq: Four times a day (QID) | ORAL | Status: DC | PRN

## 2016-04-09 NOTE — Progress Notes (Signed)
Patient: Jessica Elliott Female    DOB: May 13, 1956   60 y.o.   MRN: [id number] Visit Date: 04/09/2016  Today's Provider: Mar Daring, PA-C   Chief Complaint  Patient presents with  . Follow-up    Bilateral leg edema/Myalgia, anxiety, elbow pain   Subjective:    HPI  Jessica Elliott presents to the office today for her 6 month follow-up on:  Bilateral Leg Edema: she takes Furosemide 20 mg daily. The potassium was WNL on labs but decreased from 4.5 to 3.7 from 3 months. She was started on a potassium supplement of 27mEq. She states she does not take daily but takes every other day. She takes the furosemide with OJ and will eat a banana as well. She reports that her leg swelling is much better.  Anxiety: Was stable 6 months ago. She takes Xanax 0.5mg  to help her sleep. She is feeling much better. She reports her Myalgias are much better as well. She feels the xanax helps with that because it helps her relax when she lies down for bed instead of being tense with neck pain like before.  Chronic, Right Elbow Pain: She takes Hydrocodone-acetaminophen 10-325 MG only as needed and reports that it have been stable for the past month at least. She has not taking any pain medication recently.     Allergies  Allergen Reactions  . Ibuprofen Other (See Comments)    Drowsy  . Statins Other (See Comments)  . Codeine Hives  . Penicillins Hives   Previous Medications   ALBUTEROL (PROVENTIL HFA;VENTOLIN HFA) 108 (90 BASE) MCG/ACT INHALER    Inhale 2 puffs into the lungs every 6 (six) hours as needed.   ALPRAZOLAM (XANAX) 0.5 MG TABLET    TAKE ONE TABLET EVERY EIGHT HOURS AS NEEDED   ASPIRIN 81 MG TABLET    Take 81 mg by mouth daily.   B-D ULTRAFINE III SHORT PEN 31G X 8 MM MISC    INJECT INSULIN 4 TIMES A DAY   FLUTICASONE (FLONASE) 50 MCG/ACT NASAL SPRAY    Place 2 sprays into both nostrils daily.   FUROSEMIDE (LASIX) 20 MG TABLET    TAKE ONE TABLET BY MOUTH EVERY DAY   HYDROCODONE-ACETAMINOPHEN (NORCO) 10-325 MG TABLET    Take 1 tablet by mouth every 6 (six) hours as needed.   IBUPROFEN (ADVIL,MOTRIN) 800 MG TABLET    TAKE ONE TABLET EVERY EIGHT HOURS AS NEEDED   INSULIN ASPART (NOVOLOG) 100 UNIT/ML INJECTION    Inject into the skin. 16-18-20 three times daily   LISINOPRIL (PRINIVIL,ZESTRIL) 5 MG TABLET       LORATADINE (CLARITIN) 10 MG TABLET    Take 1 tablet by mouth daily. Reported on 01/02/2016   LOVASTATIN (MEVACOR) 40 MG TABLET    TAKE ONE TABLET AT BEDTIME   MAGIC MOUTHWASH W/LIDOCAINE SOLN    Take 5 mLs by mouth 3 (three) times daily as needed for mouth pain.   METFORMIN (GLUCOPHAGE) 1000 MG TABLET    Take 1000 mg in am and 1500 mg at bedtime   NORTRIPTYLINE (PAMELOR) 10 MG CAPSULE       NYSTATIN (MYCOSTATIN) 100000 UNIT/ML SUSPENSION       ONETOUCH VERIO TEST STRIP       PHENTERMINE 37.5 MG CAPSULE    Take by mouth.   POTASSIUM CHLORIDE (K-DUR) 10 MEQ TABLET    Take 1 tablet (10 mEq total) by mouth daily.   TOUJEO SOLOSTAR 300 UNIT/ML SOPN  VITAMIN D, ERGOCALCIFEROL, (DRISDOL) 50000 UNITS CAPS CAPSULE    TAKE 1 CAPSULE ONCE A MONTH    Review of Systems  Constitutional: Negative.   HENT: Positive for congestion and sneezing.   Eyes: Positive for itching.  Respiratory: Positive for cough. Negative for chest tightness, shortness of breath and wheezing.   Cardiovascular: Negative for chest pain, palpitations and leg swelling.  Musculoskeletal: Positive for myalgias (is better).  Allergic/Immunologic: Positive for environmental allergies (Seasonal allergies).  Psychiatric/Behavioral: Negative.     Social History  Substance Use Topics  . Smoking status: Former Research scientist (life sciences)  . Smokeless tobacco: Never Used  . Alcohol Use: 0.0 oz/week    0 Standard drinks or equivalent per week     Comment: occasionally   Objective:   BP 118/68 mmHg  Pulse 92  Temp(Src) 97.8 F (36.6 C) (Oral)  Resp 16  Wt 188 lb 12.8 oz (85.639 kg)  Physical Exam    Constitutional: She appears well-developed and well-nourished. No distress.  Neck: Normal range of motion. Neck supple.  Cardiovascular: Normal rate, regular rhythm and normal heart sounds.  Exam reveals no gallop and no friction rub.   No murmur heard. Pulmonary/Chest: Effort normal and breath sounds normal. No respiratory distress. She has no wheezes. She has no rales.  Musculoskeletal: Normal range of motion. She exhibits no edema.  Skin: She is not diaphoretic.  Psychiatric: She has a normal mood and affect. Her behavior is normal. Judgment and thought content normal.  Vitals reviewed.       Assessment & Plan:     1. Elbow pain, chronic, right Currently stable. She does not need a refill on pain medication at this time. She is to call if symptoms worsen again.   2. Anxiety Improved. Continue xanax 0.5mg  nightly. Call when refills needed. Will f/u in 3 months for CPE.   3. Middle insomnia Improved with xanax. Continue current medical treatment plan.  4. Myalgia Improved with xanax for neck pain and furosemide for leg pain. Continue current medical treatment plans.  5. Bilateral leg edema She notices much improvement in leg edema. She will continue furosemide. Continue potassium supplement. She did not want to recheck potassium today. Will have her return in 3 months for CPE and get all labs then. She is followed by Dr. Gabriel Carina for her T2DM and will have HgBA1c checked there.       Mar Daring, PA-C  [facility]

## 2016-04-09 NOTE — Patient Instructions (Addendum)
Potassium phosphate; Sodium Phosphate oral tablet What is this medicine? Potassium phosphate; sodium phosphate (poe Tass i um FOS fate; SOE dee um FOS fate) is used to increase phosphorus in the body. It is used for people who are not getting enough phosphorus from their diet or who need increased amounts. This medicine may be used for other purposes; ask your health care provider or pharmacist if you have questions. What should I tell my health care provider before I take this medicine? They need to know if you have any of these conditions: -Addison's disease -dehydration -heart disease -high levels of phosphate in the blood -kidney disease -phosphate kidney stones -sodium-restricted diet -an unusual or allergic reaction to phosphorus salts, other medicines, foods, dyes, or preservatives -pregnant or trying to get pregnant -breast-feeding How should I use this medicine? Take this medicine by mouth with a full glass of water. Follow the directions on the prescription label. Take your medicine at regular intervals. Do not take it more often than directed. Do not stop taking except on your doctor's advice. Talk to your pediatrician regarding the use of this medicine in children. While this drug may be prescribed for children as young as 5 years for selected conditions, precautions do apply. Overdosage: If you think you have taken too much of this medicine contact a poison control center or emergency room at once. NOTE: This medicine is only for you. Do not share this medicine with others. What if I miss a dose? If you miss a dose, take it as soon as you can. If it is almost time for your next dose, take only that dose. Do not take double or extra doses. What may interact with this medicine? Do not take this medicine with any of the following medications: -sevelamerThis medicine may also interact with the following medications: -antacids containing aluminum, magnesium, or calcium -calcium  supplements -cyclosporine -diuretics -eplerenone -iron supplements -magnesium supplements -medicines for blood pressure like captopril, enalapril, lisinopril -potassium supplements, salt substitutes, or low-salt milk -vitamin D supplements This list may not describe all possible interactions. Give your health care provider a list of all the medicines, herbs, non-prescription drugs, or dietary supplements you use. Also tell them if you smoke, drink alcohol, or use illegal drugs. Some items may interact with your medicine. What should I watch for while using this medicine? Visit your health care professional for regular checks on your progress. Your doctor may order blood work while you are taking this medicine. You may pass a kidney stone after starting this medicine. Contact your doctor if you have new or unusual symptoms. You may have diarrhea after starting this medicine. Contact your doctor if it continues or gets worse. What side effects may I notice from receiving this medicine? Side effects that you should report to your doctor or health care professional as soon as possible: -allergic reactions like skin rash, itching or hives, swelling of the face, lips, or tongue -breathing problems -confusion -fast, irregular heartbeat -feeling faint or lightheaded, falls -muscle cramps -tingling, pain, or numbness in the hands or feet -unusually weak or tired Side effects that usually do not require medical attention (Report these to your doctor or health care professional if they continue or are bothersome.): -diarrhea -headache -nausea, vomiting -stomach pain This list may not describe all possible side effects. Call your doctor for medical advice about side effects. You may report side effects to FDA at 1-800-FDA-1088. Where should I keep my medicine? Keep out of the reach of children.  Store at room temperature between 20 and 25 degrees C (68 and 77 degrees F). Protect from light. Throw  away any unused medicine after the expiration date. NOTE: This sheet is a summary. It may not cover all possible information. If you have questions about this medicine, talk to your doctor, pharmacist, or health care provider.    2016, Elsevier/Gold Standard. (2014-09-12 16:43:00)  Furosemide tablets What is this medicine? FUROSEMIDE (fyoor [place] se mide) is a diuretic. It helps you make more urine and to lose salt and excess water from your body. This medicine is used to treat high blood pressure, and edema or swelling from heart, kidney, or liver disease. This medicine may be used for other purposes; ask your health care provider or pharmacist if you have questions. What should I tell my health care provider before I take this medicine? They need to know if you have any of these conditions: -abnormal blood electrolytes -diarrhea or vomiting -gout -heart disease -kidney disease, small amounts of urine, or difficulty passing urine -liver disease -thyroid disease -an unusual or allergic reaction to furosemide, sulfa drugs, other medicines, foods, dyes, or preservatives -pregnant or trying to get pregnant -breast-feeding How should I use this medicine? Take this medicine by mouth with a glass of water. Follow the directions on the prescription label. You may take this medicine with or without food. If it upsets your stomach, take it with food or milk. Do not take your medicine more often than directed. Remember that you will need to pass more urine after taking this medicine. Do not take your medicine at a time of day that will cause you problems. Do not take at bedtime. Talk to your pediatrician regarding the use of this medicine in children. While this drug may be prescribed for selected conditions, precautions do apply. Overdosage: If you think you have taken too much of this medicine contact a poison control center or emergency room at once. NOTE: This medicine is only for you. Do not share  this medicine with others. What if I miss a dose? If you miss a dose, take it as soon as you can. If it is almost time for your next dose, take only that dose. Do not take double or extra doses. What may interact with this medicine? -aspirin and aspirin-like medicines -certain antibiotics -chloral hydrate -cisplatin -cyclosporine -digoxin -diuretics -laxatives -lithium -medicines for blood pressure -medicines that relax muscles for surgery -methotrexate -NSAIDs, medicines for pain and inflammation like ibuprofen, naproxen, or indomethacin -phenytoin -steroid medicines like prednisone or cortisone -sucralfate -thyroid hormones This list may not describe all possible interactions. Give your health care provider a list of all the medicines, herbs, non-prescription drugs, or dietary supplements you use. Also tell them if you smoke, drink alcohol, or use illegal drugs. Some items may interact with your medicine. What should I watch for while using this medicine? Visit your doctor or health care professional for regular checks on your progress. Check your blood pressure regularly. Ask your doctor or health care professional what your blood pressure should be, and when you should contact him or her. If you are a diabetic, check your blood sugar as directed. You may need to be on a special diet while taking this medicine. Check with your doctor. Also, ask how many glasses of fluid you need to drink a day. You must not get dehydrated. You may get drowsy or dizzy. Do not drive, use machinery, or do anything that needs mental alertness until you know  how this drug affects you. Do not stand or sit up quickly, especially if you are an older patient. This reduces the risk of dizzy or fainting spells. Alcohol can make you more drowsy and dizzy. Avoid alcoholic drinks. This medicine can make you more sensitive to the sun. Keep out of the sun. If you cannot avoid being in the sun, wear protective clothing  and use sunscreen. Do not use sun lamps or tanning beds/booths. What side effects may I notice from receiving this medicine? Side effects that you should report to your doctor or health care professional as soon as possible: -blood in urine or stools -dry mouth -fever or chills -hearing loss or ringing in the ears -irregular heartbeat -muscle pain or weakness, cramps -skin rash -stomach upset, pain, or nausea -tingling or numbness in the hands or feet -unusually weak or tired -vomiting or diarrhea -yellowing of the eyes or skin Side effects that usually do not require medical attention (report to your doctor or health care professional if they continue or are bothersome): -headache -loss of appetite -unusual bleeding or bruising This list may not describe all possible side effects. Call your doctor for medical advice about side effects. You may report side effects to FDA at 1-800-FDA-1088. Where should I keep my medicine? Keep out of the reach of children. Store at room temperature between 15 and 30 degrees C (59 and 86 degrees F). Protect from light. Throw away any unused medicine after the expiration date. NOTE: This sheet is a summary. It may not cover all possible information. If you have questions about this medicine, talk to your doctor, pharmacist, or health care provider.    2016, Elsevier/Gold Standard. (2015-02-26 13:49:50)

## 2016-05-08 ENCOUNTER — Other Ambulatory Visit: Payer: Self-pay | Admitting: Physician Assistant

## 2016-05-12 ENCOUNTER — Other Ambulatory Visit: Payer: Self-pay | Admitting: Family Medicine

## 2016-06-24 ENCOUNTER — Encounter: Payer: BLUE CROSS/BLUE SHIELD | Admitting: Physician Assistant

## 2016-07-05 ENCOUNTER — Other Ambulatory Visit: Payer: Self-pay | Admitting: Physician Assistant

## 2016-07-05 DIAGNOSIS — F411 Generalized anxiety disorder: Secondary | ICD-10-CM

## 2016-07-05 MED ORDER — ALPRAZOLAM 0.5 MG PO TABS: 0.5000 mg | ORAL_TABLET | Freq: Three times a day (TID) | ORAL | Status: DC | PRN

## 2016-07-05 NOTE — Progress Notes (Signed)
Please call in alprazolam 0.5mg  1 tab PO TID prn #30 3 [facility]ills to [facility]. Thanks.

## 2016-07-20 DIAGNOSIS — E1129 Type 2 diabetes mellitus with other diabetic kidney complication: Secondary | ICD-10-CM | POA: Diagnosis not present

## 2016-07-20 DIAGNOSIS — E1165 Type 2 diabetes mellitus with hyperglycemia: Secondary | ICD-10-CM | POA: Diagnosis not present

## 2016-07-20 DIAGNOSIS — E6609 Other obesity due to excess calories: Secondary | ICD-10-CM | POA: Diagnosis not present

## 2016-07-20 DIAGNOSIS — Z794 Long term (current) use of insulin: Secondary | ICD-10-CM | POA: Diagnosis not present

## 2016-07-28 NOTE — Telephone Encounter (Signed)
error 

## 2016-08-05 ENCOUNTER — Encounter: Payer: BLUE CROSS/BLUE SHIELD | Admitting: Physician Assistant

## 2016-08-24 ENCOUNTER — Other Ambulatory Visit: Payer: Self-pay | Admitting: Physician Assistant

## 2016-08-25 ENCOUNTER — Telehealth: Payer: Self-pay | Admitting: Physician Assistant

## 2016-08-25 DIAGNOSIS — F411 Generalized anxiety disorder: Secondary | ICD-10-CM

## 2016-08-25 MED ORDER — ALPRAZOLAM 0.5 MG PO TABS: 0.5000 mg | ORAL_TABLET | Freq: Three times a day (TID) | ORAL | 5 refills | Status: DC | PRN

## 2016-08-25 NOTE — Telephone Encounter (Signed)
Please call in alprazolam 0.5mg  Take 1/2 -1 tab PO TID prn anxiety #30 [phone].

## 2016-08-26 NOTE — Telephone Encounter (Signed)
Called in medication as below.  

## 2016-08-30 ENCOUNTER — Encounter: Payer: BLUE CROSS/BLUE SHIELD | Admitting: Physician Assistant

## 2016-08-30 NOTE — Progress Notes (Deleted)
Patient: Jessica Elliott, Female    DOB: 06/16/1956, 60 y.o.   MRN: [id number] Visit Date: 08/30/2016  Today's Provider: Mar Daring, PA-C   No chief complaint on file.  Subjective:    Annual physical exam Jessica Elliott is a 60 y.o. female who presents today for health maintenance and complete physical. She feels {DESC; WELL/FAIRLY WELL/POORLY:18703}. She reports exercising ***. She reports she is sleeping {DESC; WELL/FAIRLY WELL/POORLY:18703}.   Mammogram:10/21/15-BI-RADS Category 1:Negative Pap: 02/27/2013 Normal  Colonoscopy:12/17/2014 Repeat in 3 years.? Tdap:03/27/2013 -----------------------------------------------------------------   Review of Systems  Social History      She  reports that she has quit smoking. She has never used smokeless tobacco. She reports that she drinks alcohol. She reports that she does not use drugs.       Social History   Social History  . Marital status: Married    Spouse name: N/A  . Number of children: N/A  . Years of education: N/A   Social History Main Topics  . Smoking status: Former Research scientist (life sciences)  . Smokeless tobacco: Never Used  . Alcohol use 0.0 oz/week     Comment: occasionally  . Drug use: No  . Sexual activity: Not on file   Other Topics Concern  . Not on file   Social History Narrative  . No narrative on file    Past Medical History:  Diagnosis Date  . Allergic rhinitis, seasonal   . Diabetes mellitus without complication ([place])   . Hyperlipidemia   . Polyp of colon      Patient Active Problem List   Diagnosis Date Noted  . Elbow pain, chronic 10/10/2015  . Myalgia 10/10/2015  . Diabetic neuropathy ([place]) 07/11/2015  . Dizziness 06/25/2015  . Bilateral leg edema 06/25/2015  . Middle insomnia 06/25/2015  . Absolute anemia 04/29/2015  . Anxiety 04/29/2015  . Colon polyp 04/29/2015  . DD (diverticular disease) 04/29/2015  . Backhand tennis elbow 04/29/2015  . Hemorrhoid 04/29/2015  . Indrawn  nipple 04/29/2015  . 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 ANA positive 04/29/2015  . Avitaminosis D 04/29/2015  . Herpes zona 04/29/2015  . Diabetes mellitus without complication ([place])   . Hyperlipidemia   . Asthma, exogenous 02/27/2009  . Cervical pain 02/27/2009  . Apnea, sleep 12/16/2008  . Diabetes mellitus type 2, uncontrolled ([place]) 05/30/2006  . Hypercholesterolemia without hypertriglyceridemia 05/30/2006    Past Surgical History:  Procedure Laterality Date  . ABDOMINAL HYSTERECTOMY  1995   still have 1 ovary  . bone spur     removed from right shoulder  . SHOULDER ARTHROSCOPY    . SHOULDER SURGERY Right 2007    Family History        Family Status  Relation Status  . Mother Deceased  . Father Deceased  . Brother Deceased  . Sister Alive  . Sister Deceased  . Sister Alive  . Brother Alive  . Sister Alive  . Maternal Uncle Deceased at age 20   died from Lung cancer        Her family history includes Aneurysm in her father; Breast cancer in her maternal aunt; Cancer in her brother and sister; Crohn's disease in her brother; Diabetes in her brother, father, sister, sister, and sister; Diverticulitis in her sister; Emphysema in her mother and sister; Heart attack in her sister; Heart disease in her sister; Hyperlipidemia in her brother, brother, and mother; Hypertension in her brother, sister, sister, and sister; Pneumonia in her brother; Stroke  in her sister.    Allergies  Allergen Reactions  . Ibuprofen Other (See Comments)    Drowsy  . Statins Other (See Comments)  . Codeine Hives  . Penicillins Hives    No outpatient prescriptions have been marked as taking for the 08/30/16 encounter (Appointment) with Mar Daring, PA-C.    Patient Care Team: Mar Daring, PA-C as PCP - General (Physician Assistant)     Objective:   Vitals: There were no vitals taken for this visit.   Physical Exam   Depression Screen No flowsheet data  found.    Assessment & Plan:     Routine Health Maintenance and Physical Exam  Exercise Activities and Dietary recommendations Goals    None      Immunization History  Administered Date(s) Administered  . Tdap 03/27/2013    Health Maintenance  Topic Date Due  . Hepatitis C Screening  [birth year]  . PNEUMOCOCCAL POLYSACCHARIDE VACCINE (1) 10/24/1959  . FOOT EXAM  10/24/1967  . OPHTHALMOLOGY EXAM  10/24/1967  . HIV Screening  10/23/1972  . PAP SMEAR  10/23/1978  . COLONOSCOPY  10/24/2007  . HEMOGLOBIN A1C  01/09/2016  . INFLUENZA VACCINE  07/20/2016  . MAMMOGRAM  10/20/2017  . TETANUS/TDAP  03/28/2023      Discussed health benefits of physical activity, and encouraged her to engage in regular exercise appropriate for her age and condition.    --------------------------------------------------------------------    Mar Daring, PA-C  [place]

## 2016-09-08 ENCOUNTER — Other Ambulatory Visit: Payer: Self-pay | Admitting: Family Medicine

## 2016-09-08 NOTE — Telephone Encounter (Signed)
LOV 04/09/2016. Renaldo Fiddler, CMA

## 2016-09-09 ENCOUNTER — Encounter: Payer: Self-pay | Admitting: Physician Assistant

## 2016-09-09 ENCOUNTER — Ambulatory Visit (INDEPENDENT_AMBULATORY_CARE_PROVIDER_SITE_OTHER): Payer: BLUE CROSS/BLUE SHIELD | Admitting: Physician Assistant

## 2016-09-09 VITALS — BP 120/70 | HR 88 | Temp 98.4°F | Resp 16 | Ht 62.25 in | Wt 187.0 lb

## 2016-09-09 DIAGNOSIS — Z1272 Encounter for screening for malignant neoplasm of vagina: Secondary | ICD-10-CM | POA: Diagnosis not present

## 2016-09-09 DIAGNOSIS — Z126 Encounter for screening for malignant neoplasm of bladder: Secondary | ICD-10-CM | POA: Diagnosis not present

## 2016-09-09 DIAGNOSIS — J309 Allergic rhinitis, unspecified: Secondary | ICD-10-CM

## 2016-09-09 DIAGNOSIS — Z0000 Encounter for general adult medical examination without abnormal findings: Secondary | ICD-10-CM

## 2016-09-09 DIAGNOSIS — Z1211 Encounter for screening for malignant neoplasm of colon: Secondary | ICD-10-CM

## 2016-09-09 LAB — POCT URINALYSIS DIPSTICK
BILIRUBIN UA: NEGATIVE
Blood, UA: NEGATIVE
KETONES UA: NEGATIVE
Leukocytes, UA: NEGATIVE
Nitrite, UA: NEGATIVE
PH UA: 6
SPEC GRAV UA: 1.025
Urobilinogen, UA: 0.2

## 2016-09-09 MED ORDER — FLUTICASONE PROPIONATE 50 MCG/ACT NA SUSP: 2.0000 | Freq: Every day | NASAL | 6 refills | Status: DC

## 2016-09-09 NOTE — Patient Instructions (Signed)
Health Maintenance, Female Adopting a healthy lifestyle and getting preventive care can go a long way to promote health and wellness. Talk with your health care provider about what schedule of regular examinations is right for you. This is a good chance for you to check in with your provider about disease prevention and staying healthy. In between checkups, there are plenty of things you can do on your own. Experts have done a lot of research about which lifestyle changes and preventive measures are most likely to keep you healthy. Ask your health care provider for more information. WEIGHT AND DIET  Eat a healthy diet  Be sure to include plenty of vegetables, fruits, low-fat dairy products, and lean protein.  Do not eat a lot of foods high in solid fats, added sugars, or salt.  Get regular exercise. This is one of the most important things you can do for your health.  Most adults should exercise for at least 150 minutes each week. The exercise should increase your heart rate and make you sweat (moderate-intensity exercise).  Most adults should also do strengthening exercises at least twice a week. This is in addition to the moderate-intensity exercise.  Maintain a healthy weight  Body mass index (BMI) is a measurement that can be used to identify possible weight problems. It estimates body fat based on height and weight. Your health care provider can help determine your BMI and help you achieve or maintain a healthy weight.  For females 28 years of age and older:   A BMI below 18.5 is considered underweight.  A BMI of 18.5 to 24.9 is normal.  A BMI of 25 to 29.9 is considered overweight.  A BMI of 30 and above is considered obese.  Watch levels of cholesterol and blood lipids  You should start having your blood tested for lipids and cholesterol at 59 years of age, then have this test every 5 years.  You may need to have your cholesterol levels checked more often if:  Your lipid  or cholesterol levels are high.  You are older than 59 years of age.  You are at high risk for heart disease.  CANCER SCREENING   Lung Cancer  Lung cancer screening is recommended for adults 75-66 years old who are at high risk for lung cancer because of a history of smoking.  A yearly low-dose CT scan of the lungs is recommended for people who:  Currently smoke.  Have quit within the past 15 years.  Have at least a 30-pack-year history of smoking. A pack year is smoking an average of one pack of cigarettes a day for 1 year.  Yearly screening should continue until it has been 15 years since you quit.  Yearly screening should stop if you develop a health problem that would prevent you from having lung cancer treatment.  Breast Cancer  Practice breast self-awareness. This means understanding how your breasts normally appear and feel.  It also means doing regular breast self-exams. Let your health care provider know about any changes, no matter how small.  If you are in your 20s or 30s, you should have a clinical breast exam (CBE) by a health care provider every 1-3 years as part of a regular health exam.  If you are 25 or older, have a CBE every year. Also consider having a breast X-ray (mammogram) every year.  If you have a family history of breast cancer, talk to your health care provider about genetic screening.  If you  are at high risk for breast cancer, talk to your health care provider about having an MRI and a mammogram every year.  Breast cancer gene (BRCA) assessment is recommended for women who have family members with BRCA-related cancers. BRCA-related cancers include:  Breast.  Ovarian.  Tubal.  Peritoneal cancers.  Results of the assessment will determine the need for genetic counseling and BRCA1 and BRCA2 testing. Cervical Cancer Your health care provider may recommend that you be screened regularly for cancer of the pelvic organs (ovaries, uterus, and  vagina). This screening involves a pelvic examination, including checking for microscopic changes to the surface of your cervix (Pap test). You may be encouraged to have this screening done every 3 years, beginning at age 21.  For women ages 30-65, health care providers may recommend pelvic exams and Pap testing every 3 years, or they may recommend the Pap and pelvic exam, combined with testing for human papilloma virus (HPV), every 5 years. Some types of HPV increase your risk of cervical cancer. Testing for HPV may also be done on women of any age with unclear Pap test results.  Other health care providers may not recommend any screening for nonpregnant women who are considered low risk for pelvic cancer and who do not have symptoms. Ask your health care provider if a screening pelvic exam is right for you.  If you have had past treatment for cervical cancer or a condition that could lead to cancer, you need Pap tests and screening for cancer for at least 20 years after your treatment. If Pap tests have been discontinued, your risk factors (such as having a new sexual partner) need to be reassessed to determine if screening should resume. Some women have medical problems that increase the chance of getting cervical cancer. In these cases, your health care provider may recommend more frequent screening and Pap tests. Colorectal Cancer  This type of cancer can be detected and often prevented.  Routine colorectal cancer screening usually begins at 59 years of age and continues through 59 years of age.  Your health care provider may recommend screening at an earlier age if you have risk factors for colon cancer.  Your health care provider may also recommend using home test kits to check for hidden blood in the stool.  A small camera at the end of a tube can be used to examine your colon directly (sigmoidoscopy or colonoscopy). This is done to check for the earliest forms of colorectal  cancer.  Routine screening usually begins at age 50.  Direct examination of the colon should be repeated every 5-10 years through 59 years of age. However, you may need to be screened more often if early forms of precancerous polyps or small growths are found. Skin Cancer  Check your skin from head to toe regularly.  Tell your health care provider about any new moles or changes in moles, especially if there is a change in a mole's shape or color.  Also tell your health care provider if you have a mole that is larger than the size of a pencil eraser.  Always use sunscreen. Apply sunscreen liberally and repeatedly throughout the day.  Protect yourself by wearing long sleeves, pants, a wide-brimmed hat, and sunglasses whenever you are outside. HEART DISEASE, DIABETES, AND HIGH BLOOD PRESSURE   High blood pressure causes heart disease and increases the risk of stroke. High blood pressure is more likely to develop in:  People who have blood pressure in the high end   of the normal range (130-139/85-89 mm Hg).  People who are overweight or obese.  People who are African American.  If you are 38-23 years of age, have your blood pressure checked every 3-5 years. If you are 61 years of age or older, have your blood pressure checked every year. You should have your blood pressure measured twice--once when you are at a hospital or clinic, and once when you are not at a hospital or clinic. Record the average of the two measurements. To check your blood pressure when you are not at a hospital or clinic, you can use:  An automated blood pressure machine at a pharmacy.  A home blood pressure monitor.  If you are between 45 years and 39 years old, ask your health care provider if you should take aspirin to prevent strokes.  Have regular diabetes screenings. This involves taking a blood sample to check your fasting blood sugar level.  If you are at a normal weight and have a low risk for diabetes,  have this test once every three years after 59 years of age.  If you are overweight and have a high risk for diabetes, consider being tested at a younger age or more often. PREVENTING INFECTION  Hepatitis B  If you have a higher risk for hepatitis B, you should be screened for this virus. You are considered at high risk for hepatitis B if:  You were born in a country where hepatitis B is common. Ask your health care provider which countries are considered high risk.  Your parents were born in a high-risk country, and you have not been immunized against hepatitis B (hepatitis B vaccine).  You have HIV or AIDS.  You use needles to inject street drugs.  You live with someone who has hepatitis B.  You have had sex with someone who has hepatitis B.  You get hemodialysis treatment.  You take certain medicines for conditions, including cancer, organ transplantation, and autoimmune conditions. Hepatitis C  Blood testing is recommended for:  Everyone born from [birth year] through 1964.  Anyone with known risk factors for hepatitis C. Sexually transmitted infections (STIs)  You should be screened for sexually transmitted infections (STIs) including gonorrhea and chlamydia if:  You are sexually active and are younger than 59 years of age.  You are older than 59 years of age and your health care provider tells you that you are at risk for this type of infection.  Your sexual activity has changed since you were last screened and you are at an increased risk for chlamydia or gonorrhea. Ask your health care provider if you are at risk.  If you do not have HIV, but are at risk, it may be recommended that you take a prescription medicine daily to prevent HIV infection. This is called pre-exposure prophylaxis (PrEP). You are considered at risk if:  You are sexually active and do not regularly use condoms or know the HIV status of your partner(s).  You take drugs by injection.  You are sexually  active with a partner who has HIV. Talk with your health care provider about whether you are at high risk of being infected with HIV. If you choose to begin PrEP, you should first be tested for HIV. You should then be tested every 3 months for as long as you are taking PrEP.  PREGNANCY   If you are premenopausal and you may become pregnant, ask your health care provider about preconception counseling.  If you may  become pregnant, take 400 to 800 micrograms (mcg) of folic acid every day.  If you want to prevent pregnancy, talk to your health care provider about birth control (contraception). OSTEOPOROSIS AND MENOPAUSE   Osteoporosis is a disease in which the bones lose minerals and strength with aging. This can result in serious bone fractures. Your risk for osteoporosis can be identified using a bone density scan.  If you are 61 years of age or older, or if you are at risk for osteoporosis and fractures, ask your health care provider if you should be screened.  Ask your health care provider whether you should take a calcium or vitamin D supplement to lower your risk for osteoporosis.  Menopause may have certain physical symptoms and risks.  Hormone replacement therapy may reduce some of these symptoms and risks. Talk to your health care provider about whether hormone replacement therapy is right for you.  HOME CARE INSTRUCTIONS   Schedule regular health, dental, and eye exams.  Stay current with your immunizations.   Do not use any tobacco products including cigarettes, chewing tobacco, or electronic cigarettes.  If you are pregnant, do not drink alcohol.  If you are breastfeeding, limit how much and how often you drink alcohol.  Limit alcohol intake to no more than 1 drink per day for nonpregnant women. One drink equals 12 ounces of beer, 5 ounces of wine, or 1 ounces of hard liquor.  Do not use street drugs.  Do not share needles.  Ask your health care provider for help if  you need support or information about quitting drugs.  Tell your health care provider if you often feel depressed.  Tell your health care provider if you have ever been abused or do not feel safe at home.   This information is not intended to replace advice given to you by your health care provider. Make sure you discuss any questions you have with your health care provider.   Document Released: 06/21/2011 Document Revised: 12/27/2014 Document Reviewed: 11/07/2013 Elsevier Interactive Patient Education [employer].

## 2016-09-09 NOTE — Progress Notes (Signed)
Patient: Jessica Elliott, Female    DOB: 1956-07-23, 60 y.o.   MRN: [id number] Visit Date: 09/09/2016  Today's Provider: Mar Daring, PA-C   Chief Complaint  Patient presents with  . Annual Exam   Subjective:    Annual physical exam Jessica Elliott is a 60 y.o. female who presents today for health maintenance and complete physical. She feels fairly well. She reports never exercising. She reports she is sleeping fairly well.  ----------------------------------------------------------------- Last CPE:02/16/2013 Last Pap: 02/16/2013- Normal Last Mammogram: 10/21/2015- Bi Rads Cat 1 Colonoscopy: 12/17/2014- Colon polyp, Diverticulosis. Repeat in 3 years; Dr. Allen Norris  Review of Systems  Constitutional: Negative for chills, fatigue and fever.  HENT: Positive for postnasal drip. Negative for congestion, ear pain, rhinorrhea, sneezing and sore throat.   Eyes: Positive for redness. Negative for pain.  Respiratory: Positive for apnea. Negative for cough, shortness of breath and wheezing.   Cardiovascular: Negative for chest pain and leg swelling.  Gastrointestinal: Negative for abdominal pain, blood in stool, constipation, diarrhea and nausea.  Endocrine: Negative for polydipsia and polyphagia.  Genitourinary: Negative.  Negative for dysuria, flank pain, hematuria, pelvic pain, vaginal bleeding and vaginal discharge.  Musculoskeletal: Negative for arthralgias, back pain, gait problem and joint swelling.  Skin: Negative for rash.  Allergic/Immunologic: Positive for environmental allergies.  Neurological: Positive for numbness. Negative for dizziness, tremors, seizures, weakness, light-headedness and headaches.  Hematological: Negative for adenopathy.  Psychiatric/Behavioral: Positive for sleep disturbance. Negative for behavioral problems, confusion and dysphoric mood. The patient is not nervous/anxious and is not hyperactive.     Social History      She  reports that she  has quit smoking. She has never used smokeless tobacco. She reports that she drinks alcohol. She reports that she does not use drugs.       Social History   Social History  . Marital status: Married    Spouse name: N/A  . Number of children: 1  . Years of education: N/A   Occupational History  .      manager of customer service call center   Social History Main Topics  . Smoking status: Former Research scientist (life sciences)  . Smokeless tobacco: Never Used  . Alcohol use 0.0 oz/week     Comment: occasionally  . Drug use: No  . Sexual activity: Not Asked   Other Topics Concern  . None   Social History Narrative  . None    Past Medical History:  Diagnosis Date  . Allergic rhinitis, seasonal   . Diabetes mellitus without complication ([place])   . Hyperlipidemia   . Polyp of colon      Patient Active Problem List   Diagnosis Date Noted  . Elbow pain, chronic 10/10/2015  . Myalgia 10/10/2015  . Diabetic neuropathy ([place]) 07/11/2015  . Dizziness 06/25/2015  . Bilateral leg edema 06/25/2015  . Middle insomnia 06/25/2015  . Absolute anemia 04/29/2015  . Anxiety 04/29/2015  . Colon polyp 04/29/2015  . DD (diverticular disease) 04/29/2015  . Backhand tennis elbow 04/29/2015  . Hemorrhoid 04/29/2015  . Indrawn nipple 04/29/2015  . 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 ANA positive 04/29/2015  . Avitaminosis D 04/29/2015  . Herpes zona 04/29/2015  . Diabetes mellitus without complication ([place])   . Hyperlipidemia   . Asthma, exogenous 02/27/2009  . Cervical pain 02/27/2009  . Apnea, sleep 12/16/2008  . Diabetes mellitus type 2, uncontrolled ([place]) 05/30/2006  . Hypercholesterolemia without hypertriglyceridemia 05/30/2006    Past Surgical  History:  Procedure Laterality Date  . ABDOMINAL HYSTERECTOMY  1995   still have 1 ovary  . bone spur     removed from right shoulder  . SHOULDER ARTHROSCOPY    . SHOULDER SURGERY Right 2007    Family History        Family Status    Relation Status  . Mother Deceased  . Father Deceased  . Brother Deceased  . Sister Alive  . Sister Deceased  . Sister Alive  . Brother Alive  . Sister Alive  . Maternal Uncle Deceased at age 21   died from Lung cancer  . Maternal Aunt         Her family history includes Aneurysm in her father; Breast cancer in her maternal aunt; Cancer in her brother and sister; Crohn's disease in her brother; Diabetes in her brother, father, sister, sister, and sister; Diverticulitis in her sister; Emphysema in her mother and sister; Heart attack in her sister; Heart disease in her sister; Hyperlipidemia in her brother, brother, and mother; Hypertension in her brother, sister, sister, and sister; Pneumonia in her brother; Stroke in her sister.    Allergies  Allergen Reactions  . Ibuprofen Other (See Comments)    Drowsy  . Statins Other (See Comments)  . Codeine Hives  . Penicillins Hives    Current Meds  Medication Sig  . albuterol (PROVENTIL HFA;VENTOLIN HFA) 108 (90 BASE) MCG/ACT inhaler Inhale 2 puffs into the lungs every 6 (six) hours as needed.  . ALPRAZolam (XANAX) 0.5 MG tablet Take 1 tablet (0.5 mg total) by mouth 3 (three) times daily as needed for anxiety.  Marland Kitchen aspirin 81 MG tablet Take 81 mg by mouth daily.  . B-D ULTRAFINE III SHORT PEN 31G X 8 MM MISC INJECT INSULIN 4 TIMES A DAY  . fluticasone (FLONASE) 50 MCG/ACT nasal spray Place 2 sprays into both nostrils daily.  . furosemide (LASIX) 20 MG tablet TAKE ONE TABLET BY MOUTH EVERY DAY  . HYDROcodone-acetaminophen (NORCO) 10-325 MG tablet Take 1 tablet by mouth every 6 (six) hours as needed.  Marland Kitchen ibuprofen (ADVIL,MOTRIN) 800 MG tablet TAKE 1 TABLET BY MOUTH EVERY 8 HOURS AS NEEDED  . insulin aspart (NOVOLOG) 100 UNIT/ML injection Inject into the skin. 16-18-20 three times daily  . Insulin Glargine-Lixisenatide 100-33 UNT-MCG/ML SOPN Inject into the skin. Inject 30 Units subcutaneously once daily. Adjust dose up by 2 units daily until  fasting sugar less than 150, as directed. Take in morning.  Marland Kitchen lisinopril (PRINIVIL,ZESTRIL) 5 MG tablet   . loratadine (CLARITIN) 10 MG tablet Take 1 tablet by mouth daily. Reported on 01/02/2016  . lovastatin (MEVACOR) 40 MG tablet TAKE ONE TABLET AT BEDTIME  . metFORMIN (GLUCOPHAGE) 1000 MG tablet Take 1000 mg in am and 1500 mg at bedtime  . nortriptyline (PAMELOR) 10 MG capsule   . nystatin (MYCOSTATIN) 100000 UNIT/ML suspension   . ONETOUCH VERIO test strip   . potassium chloride (K-DUR) 10 MEQ tablet Take 1 tablet (10 mEq total) by mouth daily.  . Vitamin D, Ergocalciferol, (DRISDOL) 50000 UNITS CAPS capsule TAKE 1 CAPSULE ONCE A MONTH  . Mondamin 300 UNIT/ML SOPN     Patient Care Team: Mar Daring, PA-C as PCP - General (Physician Assistant)     Objective:   Vitals: BP 120/70 (BP Location: Right Arm, Patient Position: Sitting, Cuff Size: Normal)   Pulse 88   Temp 98.4 F (36.9 C) (Oral)   Resp 16   Ht 5'  2.25" (1.581 m)   Wt 187 lb (84.8 kg)   SpO2 96% Comment: room air  BMI 33.93 kg/m    Physical Exam  Constitutional: She is oriented to person, place, and time. She appears well-developed and well-nourished. No distress.  HENT:  Head: Normocephalic and atraumatic.  Right Ear: Hearing, tympanic membrane, external ear and ear canal normal.  Left Ear: Hearing, tympanic membrane, external ear and ear canal normal.  Nose: Nose normal.  Mouth/Throat: Uvula is midline, oropharynx is clear and moist and mucous membranes are normal. No oropharyngeal exudate.  Eyes: Conjunctivae and EOM are normal. Pupils are equal, round, and reactive to light. Right eye exhibits no discharge. Left eye exhibits no discharge. No scleral icterus.  Neck: Normal range of motion. Neck supple. No JVD present. Carotid bruit is not present. No tracheal deviation present. No thyromegaly present.  Cardiovascular: Normal rate, regular rhythm, normal heart sounds and intact  distal pulses.  Exam reveals no gallop and no friction rub.   No murmur heard. Pulmonary/Chest: Effort normal and breath sounds normal. No respiratory distress. She has no wheezes. She has no rales. She exhibits no tenderness. Right breast exhibits no inverted nipple, no mass, no nipple discharge, no skin change and no tenderness. Left breast exhibits no inverted nipple, no mass, no nipple discharge, no skin change and no tenderness. Breasts are symmetrical.  Abdominal: Soft. Bowel sounds are normal. She exhibits no distension and no mass. There is no tenderness. There is no rebound and no guarding. Hernia confirmed negative in the right inguinal area and confirmed negative in the left inguinal area.  Genitourinary: Rectum normal and vagina normal. No breast swelling, tenderness, discharge or bleeding. Pelvic exam was performed with patient supine. There is no rash, tenderness, lesion or injury on the right labia. There is no rash, tenderness, lesion or injury on the left labia. Right adnexum displays no mass, no tenderness and no fullness. Left adnexum displays no mass, no tenderness and no fullness. No erythema, tenderness or bleeding in the vagina. No signs of injury around the vagina. No vaginal discharge found.  Genitourinary Comments: Uterus surgically absent  Musculoskeletal: Normal range of motion. She exhibits no edema or tenderness.  Lymphadenopathy:    She has no cervical adenopathy.       Right: No inguinal adenopathy present.       Left: No inguinal adenopathy present.  Neurological: She is alert and oriented to person, place, and time. She has normal reflexes. No cranial nerve deficit. Coordination normal.  Skin: Skin is warm and dry. No rash noted. She is not diaphoretic.  Psychiatric: She has a normal mood and affect. Her behavior is normal. Judgment and thought content normal.  Vitals reviewed.   Depression Screen PHQ 2/9 Scores 09/09/2016  PHQ - 2 Score 0  PHQ- 9 Score 0     Current Exercise Habits: The patient does not participate in regular exercise at present Exercise limited by: None identified   Assessment & Plan:     Routine Health Maintenance and Physical Exam  Exercise Activities and Dietary recommendations Goals    None      Immunization History  Administered Date(s) Administered  . Tdap 03/27/2013    Health Maintenance  Topic Date Due  . Hepatitis C Screening  Oct 02, 1956  . PNEUMOCOCCAL POLYSACCHARIDE VACCINE (1) 10/24/1959  . FOOT EXAM  10/24/1967  . OPHTHALMOLOGY EXAM  10/24/1967  . HIV Screening  10/23/1972  . PAP SMEAR  10/23/1978  . COLONOSCOPY  10/24/2007  .  HEMOGLOBIN A1C  01/09/2016  . INFLUENZA VACCINE  07/20/2016  . MAMMOGRAM  10/20/2017  . TETANUS/TDAP  03/28/2023      Discussed health benefits of physical activity, and encouraged her to engage in regular exercise appropriate for her age and condition.   1. Annual physical exam Normal physical exam today. Will  f/u in one year at her next annual physical exam. She is to call the office in the meantime if she has any acute issue, questions or concerns.  2. Screening for bladder cancer Urinalysis was normal. - POCT Urinalysis Dipstick  3. Screening for vaginal cancer Pap collected today. Will send as below and f/u pending results. - Pap IG and HPV (high risk) DNA detection ([facility])  4. Colon cancer screening OC lite was negative. - IFOBT POC (occult bld, rslt in office)  5. Allergic rhinitis, unspecified allergic rhinitis type Stable. Diagnosis pulled for medication refill. Continue current medical treatment plan. - fluticasone (FLONASE) 50 MCG/ACT nasal spray; Place 2 sprays into both nostrils daily.  Dispense: 16 g; Refill: 6   --------------------------------------------------------------------    Mar Daring, PA-C  [facility]

## 2016-09-13 LAB — IFOBT (OCCULT BLOOD): IFOBT: NEGATIVE

## 2016-09-14 LAB — PAP IG AND HPV HIGH-RISK
HPV, HIGH-RISK: NEGATIVE
PAP SMEAR COMMENT: 0

## 2016-09-21 ENCOUNTER — Telehealth: Payer: Self-pay | Admitting: Physician Assistant

## 2016-09-21 NOTE — Telephone Encounter (Signed)
Pt is requesting results from pap test.   CB#[phone]/MW

## 2016-09-21 NOTE — Telephone Encounter (Signed)
Patient informed of results.  Thanks,  -Danyel Tobey

## 2016-09-24 ENCOUNTER — Encounter: Payer: BLUE CROSS/BLUE SHIELD | Admitting: Physician Assistant

## 2016-09-29 DIAGNOSIS — Z23 Encounter for immunization: Secondary | ICD-10-CM | POA: Diagnosis not present

## 2016-10-02 ENCOUNTER — Other Ambulatory Visit: Payer: Self-pay | Admitting: Family Medicine

## 2016-10-12 ENCOUNTER — Other Ambulatory Visit: Payer: Self-pay | Admitting: Physician Assistant

## 2016-10-12 DIAGNOSIS — Z1231 Encounter for screening mammogram for malignant neoplasm of breast: Secondary | ICD-10-CM

## 2016-10-28 ENCOUNTER — Ambulatory Visit
Admission: RE | Admit: 2016-10-28 | Discharge: 2016-10-28 | Disposition: A | Discharge status: Home / Self Care | Payer: BLUE CROSS/BLUE SHIELD | Source: Ambulatory Visit | Attending: Physician Assistant | Admitting: Physician Assistant

## 2016-10-28 DIAGNOSIS — Z1231 Encounter for screening mammogram for malignant neoplasm of breast: Secondary | ICD-10-CM | POA: Insufficient documentation

## 2016-10-29 ENCOUNTER — Telehealth: Payer: Self-pay

## 2016-10-29 NOTE — Telephone Encounter (Signed)
Patient was advised. KW 

## 2016-10-29 NOTE — Telephone Encounter (Signed)
-----   Message from Mar Daring, [place] sent at 10/29/2016 10:01 AM EST ----- Normal mammogram. Repeat screening in one year.

## 2016-10-29 NOTE — Telephone Encounter (Signed)
[id number] 

## 2016-12-29 ENCOUNTER — Telehealth: Payer: Self-pay | Admitting: Physician Assistant

## 2016-12-29 DIAGNOSIS — Z9989 Dependence on other enabling machines and devices: Principal | ICD-10-CM

## 2016-12-29 DIAGNOSIS — G4733 Obstructive sleep apnea (adult) (pediatric): Secondary | ICD-10-CM

## 2016-12-29 NOTE — Telephone Encounter (Signed)
Sleep study ordered

## 2016-12-29 NOTE — Telephone Encounter (Signed)
-----   Message from Doristine Devoid, [place] sent at 12/24/2016  4:02 PM EST ----- Regarding: FW: Sleep Study Referral    ----- Message ----- From: Leo Rod Sent: 12/24/2016   3:36 PM To: Kirbi Farrugia Nurse Subject: Sleep Study Referral                           Pt Did a sleep study about 7 or 8 years ago & would like to have another one done. She does not believe that her machine is working properly. She would like to know if she needs a new ramp. Please call - thanks, knb

## 2017-01-17 DIAGNOSIS — E1165 Type 2 diabetes mellitus with hyperglycemia: Secondary | ICD-10-CM | POA: Diagnosis not present

## 2017-01-17 DIAGNOSIS — Z794 Long term (current) use of insulin: Secondary | ICD-10-CM | POA: Diagnosis not present

## 2017-01-17 LAB — HEMOGLOBIN A1C: HEMOGLOBIN A1C: 8.8

## 2017-01-25 DIAGNOSIS — E1165 Type 2 diabetes mellitus with hyperglycemia: Secondary | ICD-10-CM | POA: Diagnosis not present

## 2017-01-25 DIAGNOSIS — E1129 Type 2 diabetes mellitus with other diabetic kidney complication: Secondary | ICD-10-CM | POA: Diagnosis not present

## 2017-01-25 DIAGNOSIS — I10 Essential (primary) hypertension: Secondary | ICD-10-CM | POA: Diagnosis not present

## 2017-01-25 DIAGNOSIS — R600 Localized edema: Secondary | ICD-10-CM | POA: Diagnosis not present

## 2017-02-21 ENCOUNTER — Telehealth: Payer: Self-pay | Admitting: Physician Assistant

## 2017-02-21 NOTE — Telephone Encounter (Signed)
Pt states we need to send the last sleep study results over to [facility] [place].  Pt states her test was about 8 to 9 years ago, done by Dr. Venia Minks.  She states her machine cut off on her 4 to 5 times last night.  States she can not sleep without the machine.  [facility] told the patient she has 30 days for insurance to even consider it.  Pt states Ria Comment @ [facility] said they have requested since Jan of this year.   Pt is requesting a call back and this sent today.

## 2017-02-21 NOTE — Telephone Encounter (Signed)
Faxed Sleep study from 12/17/10 to [facility] [place]. Patient was advised.  Thanks,  -Jeramie Scogin

## 2017-02-22 ENCOUNTER — Encounter: Payer: Self-pay | Admitting: Physician Assistant

## 2017-03-01 ENCOUNTER — Telehealth: Payer: Self-pay | Admitting: Gastroenterology

## 2017-03-01 NOTE — Telephone Encounter (Signed)
Patient left a voice message wanting to know when she is due for her next colonoscopy?

## 2017-03-02 ENCOUNTER — Encounter: Payer: Self-pay | Admitting: Physician Assistant

## 2017-03-02 ENCOUNTER — Ambulatory Visit (INDEPENDENT_AMBULATORY_CARE_PROVIDER_SITE_OTHER): Payer: BLUE CROSS/BLUE SHIELD | Admitting: Physician Assistant

## 2017-03-02 VITALS — BP 140/80 | HR 85 | Temp 97.4°F | Resp 16 | Wt 207.2 lb

## 2017-03-02 DIAGNOSIS — G4733 Obstructive sleep apnea (adult) (pediatric): Secondary | ICD-10-CM

## 2017-03-02 DIAGNOSIS — M545 Low back pain: Secondary | ICD-10-CM

## 2017-03-02 LAB — POCT URINALYSIS DIPSTICK
BILIRUBIN UA: NEGATIVE
Blood, UA: NEGATIVE
Glucose, UA: NEGATIVE
KETONES UA: NEGATIVE
LEUKOCYTES UA: NEGATIVE
Nitrite, UA: NEGATIVE
Protein, UA: NEGATIVE
Spec Grav, UA: 1.02
Urobilinogen, UA: 0.2
pH, UA: 6.5

## 2017-03-02 NOTE — Patient Instructions (Signed)

## 2017-03-02 NOTE — Progress Notes (Signed)
Patient: Jessica Elliott Female    DOB: 1956-12-16   61 y.o.   MRN: [id number] Visit Date: 03/02/2017  Today's Provider: Mar Daring, PA-C   Chief Complaint  Patient presents with  . Follow-up    Sleep apnea   Subjective:    HPI Sleep Apnea: Patient presents to follow-up on sleep apnea. Patient generally gets 6 or 7 hours of sleep per night, and states they generally have nightime awakenings. Per patient she is currently having more awakening since the CPAP machine is starting to cut off in the middle of the night. Patient uses CPAP machine every night. Current readings are being sent over from [place] where she had her card downloaded from her old machine.  She also reports that she has low back pain. No dysuria or hematuria. She wants to have her urine checked today because she has a history of recurrent UTI and pyelonephritis.    Allergies  Allergen Reactions  . Ibuprofen Other (See Comments)    Drowsy  . Statins Other (See Comments)  . Codeine Hives  . Penicillins Hives     Current Outpatient Prescriptions:  .  albuterol (PROVENTIL HFA;VENTOLIN HFA) 108 (90 BASE) MCG/ACT inhaler, Inhale 2 puffs into the lungs every 6 (six) hours as needed., Disp: , Rfl:  .  ALPRAZolam (XANAX) 0.5 MG tablet, Take 1 tablet (0.5 mg total) by mouth 3 (three) times daily as needed for anxiety., Disp: 30 tablet, Rfl: 5 .  aspirin 81 MG tablet, Take 81 mg by mouth daily., Disp: , Rfl:  .  B-D ULTRAFINE III SHORT PEN 31G X 8 MM MISC, INJECT INSULIN 4 TIMES A DAY, Disp: 200 each, Rfl: 3 .  fluticasone (FLONASE) 50 MCG/ACT nasal spray, Place 2 sprays into both nostrils daily., Disp: 16 g, Rfl: 6 .  furosemide (LASIX) 20 MG tablet, TAKE ONE TABLET BY MOUTH EVERY DAY, Disp: 30 tablet, Rfl: 6 .  HYDROcodone-acetaminophen (NORCO) 10-325 MG tablet, Take 1 tablet by mouth every 6 (six) hours as needed., Disp: 30 tablet, Rfl: 0 .  ibuprofen (ADVIL,MOTRIN) 800 MG tablet, TAKE 1  TABLET BY MOUTH EVERY 8 HOURS AS NEEDED, Disp: 45 tablet, Rfl: 3 .  insulin aspart (NOVOLOG) 100 UNIT/ML injection, Inject into the skin. 16-18-20 three times daily, Disp: , Rfl:  .  Insulin Glargine-Lixisenatide 100-33 UNT-MCG/ML SOPN, Inject into the skin. Inject 30 Units subcutaneously once daily. Adjust dose up by 2 units daily until fasting sugar less than 150, as directed. Take in morning., Disp: , Rfl:  .  lisinopril (PRINIVIL,ZESTRIL) 5 MG tablet, , Disp: , Rfl: 10 .  loratadine (CLARITIN) 10 MG tablet, Take 1 tablet by mouth daily. Reported on 01/02/2016, Disp: , Rfl:  .  lovastatin (MEVACOR) 40 MG tablet, TAKE ONE TABLET AT BEDTIME, Disp: 90 tablet, Rfl: 3 .  metFORMIN (GLUCOPHAGE) 1000 MG tablet, Take 1000 mg in am and 1500 mg at bedtime, Disp: , Rfl:  .  ONETOUCH VERIO test strip, , Disp: , Rfl: 10 .  potassium chloride (K-DUR) 10 MEQ tablet, Take 1 tablet (10 mEq total) by mouth daily., Disp: 30 tablet, Rfl: 11 .  Vitamin D, Ergocalciferol, (DRISDOL) 50000 units CAPS capsule, TAKE 1 CAPSULE BY MOUTH ONCE A MONTH, Disp: 4 capsule, Rfl: 3 .  nortriptyline (PAMELOR) 10 MG capsule, , Disp: , Rfl: 1 .  nystatin (MYCOSTATIN) 100000 UNIT/ML suspension, , Disp: , Rfl: 0  Review of Systems  Constitutional: Negative.   HENT:  Negative.   Respiratory: Negative for shortness of breath.   Cardiovascular: Negative for chest pain, palpitations and leg swelling.  Genitourinary: Negative for dysuria, frequency, hematuria and urgency.  Musculoskeletal: Positive for back pain.  Neurological: Negative.   Psychiatric/Behavioral: Positive for sleep disturbance.    Social History  Substance Use Topics  . Smoking status: Former Research scientist (life sciences)  . Smokeless tobacco: Never Used  . Alcohol use 0.0 oz/week     Comment: occasionally   Objective:   BP 140/80 (BP Location: Right Arm, Patient Position: Sitting, Cuff Size: Normal)   Pulse 85   Temp 97.4 F (36.3 C) (Oral)   Resp 16   Wt 207 lb 3.2 oz (94 kg)    SpO2 97%   BMI 37.59 kg/m    Physical Exam  Constitutional: She appears well-developed and well-nourished. No distress.  Neck: Normal range of motion. Neck supple. No tracheal deviation present. No thyromegaly present.  Cardiovascular: Normal rate, regular rhythm and normal heart sounds.  Exam reveals no gallop and no friction rub.   No murmur heard. Pulmonary/Chest: Effort normal and breath sounds normal. No respiratory distress. She has no wheezes. She has no rales.  Lymphadenopathy:    She has no cervical adenopathy.  Skin: She is not diaphoretic.  Psychiatric: She has a normal mood and affect. Her behavior is normal. Judgment and thought content normal.  Vitals reviewed.    Assessment & Plan:     1. Obstructive sleep apnea syndrome Previously has been well controlled with CPAP machine. However, recently her machine has been malfunctioning and causing her to have multiple awakenings through the night due to the machine cutting off. Rx written and will be sent to [facility] for a new machine and supplies.   2. Bilateral low back pain, unspecified chronicity, with sciatica presence unspecified UA negative today. - POCT urinalysis dipstick       Mar Daring, PA-C  [facility]

## 2017-03-04 NOTE — Telephone Encounter (Signed)
Pt's last colonoscopy was 12/17/14. Per Dr. Dorothey Baseman pathology result, she is to repeat in 3 years. She is due 11/2017.

## 2017-03-12 ENCOUNTER — Other Ambulatory Visit: Payer: Self-pay | Admitting: Physician Assistant

## 2017-03-12 DIAGNOSIS — E1142 Type 2 diabetes mellitus with diabetic polyneuropathy: Secondary | ICD-10-CM

## 2017-03-12 DIAGNOSIS — Z794 Long term (current) use of insulin: Principal | ICD-10-CM

## 2017-03-18 ENCOUNTER — Other Ambulatory Visit: Payer: Self-pay | Admitting: Physician Assistant

## 2017-03-18 DIAGNOSIS — F411 Generalized anxiety disorder: Secondary | ICD-10-CM

## 2017-03-18 NOTE — Telephone Encounter (Signed)
Prescription called in to [facility].  Thanks,  -Joseline 

## 2017-03-19 ENCOUNTER — Other Ambulatory Visit: Payer: Self-pay | Admitting: Physician Assistant

## 2017-03-26 DIAGNOSIS — G4733 Obstructive sleep apnea (adult) (pediatric): Secondary | ICD-10-CM | POA: Diagnosis not present

## 2017-04-06 ENCOUNTER — Other Ambulatory Visit: Payer: Self-pay | Admitting: Physician Assistant

## 2017-04-06 DIAGNOSIS — E7800 Pure hypercholesterolemia, unspecified: Secondary | ICD-10-CM

## 2017-06-23 DIAGNOSIS — E1165 Type 2 diabetes mellitus with hyperglycemia: Secondary | ICD-10-CM | POA: Diagnosis not present

## 2017-06-23 DIAGNOSIS — Z794 Long term (current) use of insulin: Secondary | ICD-10-CM | POA: Diagnosis not present

## 2017-06-28 DIAGNOSIS — I10 Essential (primary) hypertension: Secondary | ICD-10-CM | POA: Diagnosis not present

## 2017-06-28 DIAGNOSIS — R600 Localized edema: Secondary | ICD-10-CM | POA: Diagnosis not present

## 2017-06-28 DIAGNOSIS — R310 Gross hematuria: Secondary | ICD-10-CM | POA: Diagnosis not present

## 2017-06-28 DIAGNOSIS — E1129 Type 2 diabetes mellitus with other diabetic kidney complication: Secondary | ICD-10-CM | POA: Diagnosis not present

## 2017-06-28 DIAGNOSIS — E1165 Type 2 diabetes mellitus with hyperglycemia: Secondary | ICD-10-CM | POA: Diagnosis not present

## 2017-07-08 ENCOUNTER — Other Ambulatory Visit: Payer: Self-pay | Admitting: Physician Assistant

## 2017-07-08 DIAGNOSIS — E7800 Pure hypercholesterolemia, unspecified: Secondary | ICD-10-CM

## 2017-07-29 ENCOUNTER — Other Ambulatory Visit: Payer: Self-pay | Admitting: Family Medicine

## 2017-08-25 DIAGNOSIS — L815 Leukoderma, not elsewhere classified: Secondary | ICD-10-CM | POA: Diagnosis not present

## 2017-08-29 ENCOUNTER — Other Ambulatory Visit: Payer: Self-pay | Admitting: Physician Assistant

## 2017-08-29 DIAGNOSIS — F411 Generalized anxiety disorder: Secondary | ICD-10-CM

## 2017-08-29 NOTE — Telephone Encounter (Signed)
[facility] into [facility] 

## 2017-08-31 DIAGNOSIS — G4733 Obstructive sleep apnea (adult) (pediatric): Secondary | ICD-10-CM | POA: Diagnosis not present

## 2017-09-24 ENCOUNTER — Other Ambulatory Visit: Payer: Self-pay | Admitting: Family Medicine

## 2017-09-28 DIAGNOSIS — Z23 Encounter for immunization: Secondary | ICD-10-CM | POA: Diagnosis not present

## 2017-10-24 ENCOUNTER — Other Ambulatory Visit: Payer: Self-pay | Admitting: Physician Assistant

## 2017-10-24 DIAGNOSIS — Z1231 Encounter for screening mammogram for malignant neoplasm of breast: Secondary | ICD-10-CM

## 2017-11-02 ENCOUNTER — Ambulatory Visit
Admission: RE | Admit: 2017-11-02 | Discharge: 2017-11-02 | Disposition: A | Discharge status: Home / Self Care | Payer: BLUE CROSS/BLUE SHIELD | Source: Ambulatory Visit | Attending: Physician Assistant | Admitting: Physician Assistant

## 2017-11-02 DIAGNOSIS — Z1231 Encounter for screening mammogram for malignant neoplasm of breast: Secondary | ICD-10-CM | POA: Diagnosis not present

## 2017-11-03 ENCOUNTER — Telehealth: Payer: Self-pay

## 2017-11-03 NOTE — Telephone Encounter (Signed)
Patient advised as directed below.  Thanks,  -Brynda Heick 

## 2017-11-03 NOTE — Telephone Encounter (Signed)
-----   Message from Mar Daring, [place] sent at 11/03/2017  8:31 AM EST ----- Normal mammogram. Repeat screening in one year.

## 2017-11-04 ENCOUNTER — Telehealth: Payer: Self-pay | Admitting: Gastroenterology

## 2017-11-04 NOTE — Telephone Encounter (Signed)
Patient called to schedule her colonoscopy with Dr. Allen Norris. She stated she is due this year.

## 2017-11-09 ENCOUNTER — Other Ambulatory Visit: Payer: Self-pay

## 2017-11-09 ENCOUNTER — Telehealth: Payer: Self-pay

## 2017-11-09 DIAGNOSIS — Z86010 Personal history of colonic polyps: Secondary | ICD-10-CM

## 2017-11-09 NOTE — Telephone Encounter (Signed)
Gastroenterology Pre-Procedure Review  Request Date:  Requesting Physician: Dr.   PATIENT REVIEW QUESTIONS: The patient responded to the following health history questions as indicated:    1. Are you having any GI issues? no 2. Do you have a personal history of Polyps? yes (repeat 5 years) 3. Do you have a family history of Colon Cancer or Polyps? no 4. Diabetes Mellitus? yes (Type 2) 5. Joint replacements in the past 12 months?no 6. Major health problems in the past 3 months?no 7. Any artificial heart valves, MVP, or defibrillator?no    MEDICATIONS & ALLERGIES:    Patient reports the following regarding taking any anticoagulation/antiplatelet therapy:   Plavix, Coumadin, Eliquis, Xarelto, Lovenox, Pradaxa, Brilinta, or Effient? no Aspirin? yes (ASA 81mg )  Patient confirms/reports the following medications:  Current Outpatient Medications  Medication Sig Dispense Refill  . albuterol (PROVENTIL HFA;VENTOLIN HFA) 108 (90 BASE) MCG/ACT inhaler Inhale 2 puffs into the lungs every 6 (six) hours as needed.    . ALPRAZolam (XANAX) 0.5 MG tablet TAKE 1/2 TO 1 TABLET 3 TIMES DAILY AS NEEDED FOR ANXIETY 30 tablet 5  . aspirin 81 MG tablet Take 81 mg by mouth daily.    . B-D ULTRAFINE III SHORT PEN 31G X 8 MM MISC INJECT INSULIN FOUR TIMES DAILY 200 each 5  . fluticasone (FLONASE) 50 MCG/ACT nasal spray Place 2 sprays into both nostrils daily. 16 g 6  . furosemide (LASIX) 20 MG tablet TAKE ONE TABLET BY MOUTH EVERY DAY 30 tablet 6  . HYDROcodone-acetaminophen (NORCO) 10-325 MG tablet Take 1 tablet by mouth every 6 (six) hours as needed. 30 tablet 0  . IBU 800 MG tablet TAKE ONE TABLET BY MOUTH EVERY 8 HOURS AS NEEDED 45 tablet 3  . insulin aspart (NOVOLOG) 100 UNIT/ML injection Inject into the skin. 16-18-20 three times daily    . Insulin Glargine-Lixisenatide 100-33 UNT-MCG/ML SOPN Inject into the skin. Inject 30 Units subcutaneously once daily. Adjust dose up by 2 units daily until fasting  sugar less than 150, as directed. Take in morning.    Marland Kitchen lisinopril (PRINIVIL,ZESTRIL) 5 MG tablet   10  . loratadine (CLARITIN) 10 MG tablet Take 1 tablet by mouth daily. Reported on 01/02/2016    . lovastatin (MEVACOR) 40 MG tablet TAKE ONE TABLET AT BEDTIME 90 tablet 1  . metFORMIN (GLUCOPHAGE) 1000 MG tablet Take 1000 mg in am and 1500 mg at bedtime    . nortriptyline (PAMELOR) 10 MG capsule   1  . nystatin (MYCOSTATIN) 100000 UNIT/ML suspension   0  . ONETOUCH VERIO test strip   10  . potassium chloride (K-DUR) 10 MEQ tablet Take 1 tablet (10 mEq total) by mouth daily. 30 tablet 11  . Vitamin D, Ergocalciferol, (DRISDOL) 50000 units CAPS capsule Take 1 capsule (50,000 Units total) by mouth every 7 (seven) days. 4 capsule 3   No current facility-administered medications for this visit.     Patient confirms/reports the following allergies:  Allergies  Allergen Reactions  . Ibuprofen Other (See Comments)    Drowsy  . Statins Other (See Comments)  . Codeine Hives  . Penicillins Hives    No orders of the defined types were placed in this encounter.   AUTHORIZATION INFORMATION Primary Insurance: 1D#: Group #:  Secondary Insurance: 1D#: Group #:  SCHEDULE INFORMATION: Date:  Time: Location:

## 2017-11-09 NOTE — Telephone Encounter (Signed)
Pt scheduled for a colonoscopy with Dr. Allen Norris on 11/22/17 at [facility].

## 2017-11-17 ENCOUNTER — Other Ambulatory Visit: Payer: Self-pay

## 2017-11-17 ENCOUNTER — Telehealth: Payer: Self-pay | Admitting: Gastroenterology

## 2017-11-17 MED ORDER — NA SULFATE-K SULFATE-MG SULF 17.5-3.13-1.6 GM/177ML PO SOLN: 1.0000 | ORAL | 0 refills | Status: DC

## 2017-11-17 NOTE — Telephone Encounter (Signed)
Emailed pt her packet and sent rx to her pharmacy.

## 2017-11-17 NOTE — Telephone Encounter (Signed)
Patient called and stated she hasn't received her procedure packet yet. Please call her today.

## 2017-11-21 ENCOUNTER — Encounter: Payer: Self-pay | Admitting: *Deleted

## 2017-11-22 ENCOUNTER — Ambulatory Visit: Payer: BLUE CROSS/BLUE SHIELD | Admitting: Anesthesiology

## 2017-11-22 ENCOUNTER — Encounter
Admission: RE | Disposition: A | Discharge status: Home / Self Care | Payer: Self-pay | Source: Ambulatory Visit | Attending: Gastroenterology

## 2017-11-22 ENCOUNTER — Ambulatory Visit
Admission: RE | Admit: 2017-11-22 | Discharge: 2017-11-22 | Disposition: A | Discharge status: Home / Self Care | Payer: BLUE CROSS/BLUE SHIELD | Source: Ambulatory Visit | Attending: Gastroenterology | Admitting: Gastroenterology

## 2017-11-22 ENCOUNTER — Encounter: Payer: Self-pay | Admitting: *Deleted

## 2017-11-22 DIAGNOSIS — J45909 Unspecified asthma, uncomplicated: Secondary | ICD-10-CM | POA: Insufficient documentation

## 2017-11-22 DIAGNOSIS — K5730 Diverticulosis of large intestine without perforation or abscess without bleeding: Secondary | ICD-10-CM | POA: Diagnosis not present

## 2017-11-22 DIAGNOSIS — Z9989 Dependence on other enabling machines and devices: Secondary | ICD-10-CM | POA: Diagnosis not present

## 2017-11-22 DIAGNOSIS — F419 Anxiety disorder, unspecified: Secondary | ICD-10-CM | POA: Diagnosis not present

## 2017-11-22 DIAGNOSIS — Z1211 Encounter for screening for malignant neoplasm of colon: Secondary | ICD-10-CM | POA: Diagnosis not present

## 2017-11-22 DIAGNOSIS — Z885 Allergy status to narcotic agent status: Secondary | ICD-10-CM | POA: Diagnosis not present

## 2017-11-22 DIAGNOSIS — Z87891 Personal history of nicotine dependence: Secondary | ICD-10-CM | POA: Insufficient documentation

## 2017-11-22 DIAGNOSIS — E119 Type 2 diabetes mellitus without complications: Secondary | ICD-10-CM | POA: Insufficient documentation

## 2017-11-22 DIAGNOSIS — Z79899 Other long term (current) drug therapy: Secondary | ICD-10-CM | POA: Insufficient documentation

## 2017-11-22 DIAGNOSIS — Z860100 Personal history of colon polyps, unspecified: Secondary | ICD-10-CM

## 2017-11-22 DIAGNOSIS — Z794 Long term (current) use of insulin: Secondary | ICD-10-CM | POA: Diagnosis not present

## 2017-11-22 DIAGNOSIS — E785 Hyperlipidemia, unspecified: Secondary | ICD-10-CM | POA: Insufficient documentation

## 2017-11-22 DIAGNOSIS — Z7982 Long term (current) use of aspirin: Secondary | ICD-10-CM | POA: Insufficient documentation

## 2017-11-22 DIAGNOSIS — K640 First degree hemorrhoids: Secondary | ICD-10-CM | POA: Insufficient documentation

## 2017-11-22 DIAGNOSIS — Z888 Allergy status to other drugs, medicaments and biological substances status: Secondary | ICD-10-CM | POA: Diagnosis not present

## 2017-11-22 DIAGNOSIS — Z880 Allergy status to penicillin: Secondary | ICD-10-CM | POA: Insufficient documentation

## 2017-11-22 DIAGNOSIS — Z886 Allergy status to analgesic agent status: Secondary | ICD-10-CM | POA: Insufficient documentation

## 2017-11-22 DIAGNOSIS — K5790 Diverticulosis of intestine, part unspecified, without perforation or abscess without bleeding: Secondary | ICD-10-CM | POA: Diagnosis not present

## 2017-11-22 DIAGNOSIS — I10 Essential (primary) hypertension: Secondary | ICD-10-CM | POA: Insufficient documentation

## 2017-11-22 DIAGNOSIS — D122 Benign neoplasm of ascending colon: Secondary | ICD-10-CM | POA: Diagnosis not present

## 2017-11-22 DIAGNOSIS — K635 Polyp of colon: Secondary | ICD-10-CM | POA: Diagnosis not present

## 2017-11-22 DIAGNOSIS — G4730 Sleep apnea, unspecified: Secondary | ICD-10-CM | POA: Diagnosis not present

## 2017-11-22 DIAGNOSIS — D123 Benign neoplasm of transverse colon: Secondary | ICD-10-CM

## 2017-11-22 HISTORY — PX: COLONOSCOPY WITH PROPOFOL: SHX5780

## 2017-11-22 HISTORY — DX: Essential (primary) hypertension: I10

## 2017-11-22 HISTORY — DX: Sleep apnea, unspecified: G47.30

## 2017-11-22 LAB — GLUCOSE, CAPILLARY: Glucose-Capillary: 185 mg/dL — ABNORMAL HIGH (ref 65–99)

## 2017-11-22 SURGERY — COLONOSCOPY WITH PROPOFOL
Anesthesia: General

## 2017-11-22 MED ORDER — SODIUM CHLORIDE 0.9 % IV SOLN
INTRAVENOUS | Status: DC
  Administered 2017-11-22: 09:00:00 via INTRAVENOUS

## 2017-11-22 MED ORDER — PROPOFOL 10 MG/ML IV BOLUS
INTRAVENOUS | Status: DC | PRN
  Administered 2017-11-22: 70 mg via INTRAVENOUS

## 2017-11-22 MED ORDER — PROPOFOL 500 MG/50ML IV EMUL
INTRAVENOUS | Status: DC | PRN
  Administered 2017-11-22: 140 ug/kg/min via INTRAVENOUS

## 2017-11-22 MED ORDER — PROPOFOL 500 MG/50ML IV EMUL
INTRAVENOUS | Status: AC
  Filled 2017-11-22: qty 50

## 2017-11-22 MED ORDER — PROPOFOL 500 MG/50ML IV EMUL
INTRAVENOUS | Status: AC
  Filled 2017-11-22: qty 150

## 2017-11-22 NOTE — Anesthesia Preprocedure Evaluation (Signed)
Anesthesia Evaluation  Patient identified by MRN, date of birth, ID band Patient awake    Reviewed: Allergy & Precautions, NPO status , Patient's Chart, lab work & pertinent test results  History of Anesthesia Complications Negative for: history of anesthetic complications  Airway Mallampati: III       Dental   Pulmonary asthma , sleep apnea and Continuous Positive Airway Pressure Ventilation , former smoker,           Cardiovascular hypertension, Pt. on medications (-) Past MI and (-) CHF (-) dysrhythmias (-) Valvular Problems/Murmurs     Neuro/Psych neg Seizures Anxiety    GI/Hepatic Neg liver ROS, neg GERD  ,  Endo/Other  diabetes, Type 2, Insulin Dependent, Oral Hypoglycemic Agents  Renal/GU negative Renal ROS     Musculoskeletal   Abdominal   Peds  Hematology  (+) anemia ,   Anesthesia Other Findings   Reproductive/Obstetrics                            Anesthesia Physical Anesthesia Plan  ASA: III  Anesthesia Plan: General   Post-op Pain Management:    Induction: Intravenous  PONV Risk Score and Plan: 3 and TIVA, Propofol infusion and Treatment may vary due to age or medical condition  Airway Management Planned: Nasal Cannula  Additional Equipment:   Intra-op Plan:   Post-operative Plan:   Informed Consent: I have reviewed the patients History and Physical, chart, labs and discussed the procedure including the risks, benefits and alternatives for the proposed anesthesia with the patient or authorized representative who has indicated his/her understanding and acceptance.     Plan Discussed with:   Anesthesia Plan Comments:         Anesthesia Quick Evaluation

## 2017-11-22 NOTE — Op Note (Signed)
[facility] Patient Name: Jessica Elliott Procedure Date: 11/22/2017 9:18 AM MRN: [id number] Account #: [id number] Date of Birth: 1956/05/26 Admit Type: Outpatient Age: 61 Room: [facility] ENDO ROOM 4 Gender: Female Note Status: Finalized Procedure:            Colonoscopy Indications:          High risk colon cancer surveillance: Personal history                        of colonic polyps Providers:            Lucilla Lame MD, MD Referring MD:         Mar Daring (Referring MD) Medicines:            Propofol per Anesthesia Complications:        No immediate complications. Procedure:            Pre-Anesthesia Assessment:                       - Prior to the procedure, a History and Physical was                        performed, and patient medications and allergies were                        reviewed. The patient's tolerance of previous                        anesthesia was also reviewed. The risks and benefits of                        the procedure and the sedation options and risks were                        discussed with the patient. All questions were                        answered, and informed consent was obtained. Prior                        Anticoagulants: The patient has taken no previous                        anticoagulant or antiplatelet agents. ASA Grade                        Assessment: II - A patient with mild systemic disease.                        After reviewing the risks and benefits, the patient was                        deemed in satisfactory condition to undergo the                        procedure.                       After obtaining informed consent, the colonoscope was  passed under direct vision. Throughout the procedure,                        the patient's blood pressure, pulse, and oxygen                        saturations were monitored continuously. The                         Colonoscope was introduced through the anus and                        advanced to the the cecum, identified by appendiceal                        orifice and ileocecal valve. The colonoscopy was                        performed without difficulty. The patient tolerated the                        procedure well. The quality of the bowel preparation                        was excellent. Findings:      The perianal and digital rectal examinations were normal.      Three sessile polyps were found in the transverse colon. The polyps were       3 to 4 mm in size. These polyps were removed with a cold snare.       Resection and retrieval were complete.      A 4 mm polyp was found in the ascending colon. The polyp was sessile.       The polyp was removed with a cold snare. Resection and retrieval were       complete.      Multiple small-mouthed diverticula were found in the sigmoid colon.      Non-bleeding internal hemorrhoids were found during retroflexion. The       hemorrhoids were Grade I (internal hemorrhoids that do not prolapse). Impression:           - Three 3 to 4 mm polyps in the transverse colon,                        removed with a cold snare. Resected and retrieved.                       - One 4 mm polyp in the ascending colon, removed with a                        cold snare. Resected and retrieved.                       - Diverticulosis in the sigmoid colon.                       - Non-bleeding internal hemorrhoids. Recommendation:       - Discharge patient to home.                       - Resume previous diet.                       -  Continue present medications.                       - Await pathology results.                       - Repeat colonoscopy in 5 years for surveillance. Procedure Code(s):    --- Professional ---                       [phone], Colonoscopy, flexible; with removal of tumor(s),                        polyp(s), or other lesion(s) by snare  technique Diagnosis Code(s):    --- Professional ---                       Z86.010, Personal history of colonic polyps                       D12.3, Benign neoplasm of transverse colon (hepatic                        flexure or splenic flexure)                       D12.2, Benign neoplasm of ascending colon CPT copyright 2016 American Medical Association. All rights reserved. The codes documented in this report are preliminary and upon coder review may  be revised to meet current compliance requirements. Lucilla Lame MD, MD 11/22/2017 9:43:21 AM This report has been signed electronically. Number of Addenda: 0 Note Initiated On: 11/22/2017 9:18 AM Scope Withdrawal Time: 0 hours 9 minutes 44 seconds  Total Procedure Duration: 0 hours 15 minutes 5 seconds       [facility]

## 2017-11-22 NOTE — H&P (Signed)
Lucilla Lame, MD [facility] [address], [place], [place] [address] Phone:[phone] Fax : [phone]  Primary Care Physician:  Mar Daring, PA-C Primary Gastroenterologist:  Dr. Allen Norris  Pre-Procedure History & Physical: HPI:  Jessica Elliott is a 61 y.o. female is here for an colonoscopy.   Past Medical History:  Diagnosis Date  . Allergic rhinitis, seasonal   . Diabetes mellitus without complication ([place])   . Hyperlipidemia   . Hypertension   . Polyp of colon   . Sleep apnea     Past Surgical History:  Procedure Laterality Date  . ABDOMINAL HYSTERECTOMY  1995   still have 1 ovary  . bone spur     removed from right shoulder  . SHOULDER ARTHROSCOPY    . SHOULDER SURGERY Right 2007    Prior to Admission medications   Medication Sig Start Date End Date Taking? Authorizing Provider  ALPRAZolam Duanne Moron) 0.5 MG tablet TAKE 1/2 TO 1 TABLET 3 TIMES DAILY AS NEEDED FOR ANXIETY 08/29/17  Yes Mar Daring, PA-C  aspirin 81 MG tablet Take 81 mg by mouth daily.   Yes [provider]  insulin aspart (NOVOLOG) 100 UNIT/ML injection Inject into the skin. 16-18-20 three times daily   Yes [provider]  Insulin Glargine-Lixisenatide 100-33 UNT-MCG/ML SOPN Inject into the skin. Inject 30 Units subcutaneously once daily. Adjust dose up by 2 units daily until fasting sugar less than 150, as directed. Take in morning. 07/20/16  Yes [provider]  lisinopril (PRINIVIL,ZESTRIL) 5 MG tablet  02/13/16  Yes [provider]  lovastatin (MEVACOR) 40 MG tablet TAKE ONE TABLET AT BEDTIME 07/08/17  Yes Burnette, Jennifer M, PA-C  metFORMIN (GLUCOPHAGE) 1000 MG tablet Take 1000 mg in am and 1500 mg at bedtime   Yes [provider]  Vitamin D, Ergocalciferol, (DRISDOL) 50000 units CAPS capsule Take 1 capsule (50,000 Units total) by mouth every 7 (seven) days. 09/26/17  Yes Mar Daring, PA-C  albuterol (PROVENTIL HFA;VENTOLIN HFA)  108 (90 BASE) MCG/ACT inhaler Inhale 2 puffs into the lungs every 6 (six) hours as needed.    [provider]  B-D ULTRAFINE III SHORT PEN 31G X 8 MM MISC INJECT INSULIN FOUR TIMES DAILY 03/12/17   Mar Daring, PA-C  fluticasone [facility]) 50 MCG/ACT nasal spray Place 2 sprays into both nostrils daily. 09/09/16   Mar Daring, PA-C  furosemide (LASIX) 20 MG tablet TAKE ONE TABLET BY MOUTH EVERY DAY 10/02/15   Mar Daring, PA-C  HYDROcodone-acetaminophen (NORCO) 10-325 MG tablet Take 1 tablet by mouth every 6 (six) hours as needed. 04/09/16   Mar Daring, PA-C  IBU 800 MG tablet TAKE ONE TABLET BY MOUTH EVERY 8 HOURS AS NEEDED 03/20/17   Mar Daring, PA-C  loratadine (CLARITIN) 10 MG tablet Take 1 tablet by mouth daily. Reported on 01/02/2016 05/28/14   [provider]  Na Sulfate-K Sulfate-Mg Sulf (SUPREP BOWEL PREP KIT) 17.5-3.13-1.6 GM/177ML SOLN Take 1 kit by mouth as directed. 11/17/17   Lucilla Lame, MD  nortriptyline (PAMELOR) 10 MG capsule  01/26/16   [provider]  nystatin (MYCOSTATIN) 100000 UNIT/ML suspension  03/15/16   [provider]  [facility] VERIO test strip  07/26/15   [provider]  potassium chloride (K-DUR) 10 MEQ tablet Take 1 tablet (10 mEq total) by mouth daily. Patient not taking: Reported on 11/22/2017 10/11/15   Mar Daring, PA-C    Allergies as of 11/09/2017 - Review  Complete 03/02/2017  Allergen Reaction Noted  . Ibuprofen Other (See Comments) 04/29/2015  . Statins Other (See Comments) 04/29/2015  . Codeine Hives 02/28/2013  . Penicillins Hives 02/28/2013    Family History  Problem Relation Age of Onset  . Emphysema Mother   . Hyperlipidemia Mother   . Diabetes Father   . Aneurysm Father   . Cancer Brother   . Hyperlipidemia Brother   . Pneumonia Brother   . Diabetes Sister   . Hypertension Sister   . Diverticulitis Sister   . Stroke Sister   . Diabetes Sister   .  Hypertension Sister   . Heart attack Sister   . Diabetes Sister   . Hypertension Sister   . Cancer Sister   . Heart disease Sister   . Emphysema Sister   . Diabetes Brother   . Hyperlipidemia Brother   . Hypertension Brother   . Crohn's disease Brother   . Breast cancer Maternal Aunt   . Breast cancer Other     Social History   Socioeconomic History  . Marital status: Married    Spouse name: Not on file  . Number of children: 1  . Years of education: Not on file  . Highest education level: Not on file  Social Needs  . Financial resource strain: Not on file  . Food insecurity - worry: Not on file  . Food insecurity - inability: Not on file  . Transportation needs - medical: Not on file  . Transportation needs - non-medical: Not on file  Occupational History    CommentAstronomer of customer service call center  Tobacco Use  . Smoking status: Former Research scientist (life sciences)  . Smokeless tobacco: Never Used  Substance and Sexual Activity  . Alcohol use: Yes    Alcohol/week: 0.0 oz    Comment: occasionally  . Drug use: No  . Sexual activity: Not on file  Other Topics Concern  . Not on file  Social History Narrative  . Not on file    Review of Systems: See HPI, otherwise negative ROS  Physical Exam: BP 139/65   Pulse 84   Temp (!) 97.5 F (36.4 C) (Tympanic)   Resp 18   Ht _0  (1.575 m)   Wt 207 lb (93.9 kg)   SpO2 98%   BMI 37.86 kg/m  General:   Alert,  pleasant and cooperative in NAD Head:  Normocephalic and atraumatic. Neck:  Supple; no masses or thyromegaly. Lungs:  Clear throughout to auscultation.    Heart:  Regular rate and rhythm. Abdomen:  Soft, nontender and nondistended. Normal bowel sounds, without guarding, and without rebound.   Neurologic:  Alert and  oriented x4;  grossly normal neurologically.  Impression/Plan: Jessica Elliott is here for an colonoscopy to be performed for history of colon polyps  Risks, benefits, limitations, and alternatives  regarding  colonoscopy have been reviewed with the patient.  Questions have been answered.  All parties agreeable.   Lucilla Lame, MD  11/22/2017, 9:14 AM

## 2017-11-22 NOTE — Anesthesia Postprocedure Evaluation (Signed)
Anesthesia Post Note  Patient: Jessica Elliott  Procedure(s) Performed: COLONOSCOPY WITH PROPOFOL (N/A )  Patient location during evaluation: Endoscopy Anesthesia Type: General Level of consciousness: awake and alert Pain management: pain level controlled Vital Signs Assessment: post-procedure vital signs reviewed and stable Respiratory status: spontaneous breathing and respiratory function stable Cardiovascular status: stable Anesthetic complications: no     Last Vitals:  Vitals:   11/22/17 1004 11/22/17 1014  BP: 115/66 (!) 108/49  Pulse: 85 79  Resp: 16 18  Temp:    SpO2: 97% 97%    Last Pain:  Vitals:   11/22/17 0943  TempSrc: Tympanic                 KEPHART,WILLIAM K

## 2017-11-22 NOTE — Anesthesia Post-op Follow-up Note (Signed)
Anesthesia QCDR form completed.        

## 2017-11-22 NOTE — Transfer of Care (Signed)
Immediate Anesthesia Transfer of Care Note  Patient: Jessica Elliott  Procedure(s) Performed: COLONOSCOPY WITH PROPOFOL (N/A )  Patient Location: PACU  Anesthesia Type:General  Level of Consciousness: awake, alert  and oriented  Airway & Oxygen Therapy: Patient Spontanous Breathing and Patient connected to nasal cannula oxygen  Post-op Assessment: Report given to RN and Post -op Vital signs reviewed and stable  Post vital signs: Reviewed and stable  Last Vitals:  Vitals:   11/22/17 0943 11/22/17 0944  BP: 122/77 122/77  Pulse: 89 88  Resp: 18 15  Temp: (!) 36.4 C (!) 36.4 C  SpO2: 97% 96%    Last Pain:  Vitals:   11/22/17 0943  TempSrc: Tympanic         Complications: No apparent anesthesia complications

## 2017-11-23 ENCOUNTER — Encounter: Payer: Self-pay | Admitting: Gastroenterology

## 2017-11-23 LAB — SURGICAL PATHOLOGY

## 2017-11-24 ENCOUNTER — Encounter: Payer: Self-pay | Admitting: Gastroenterology

## 2017-12-08 ENCOUNTER — Other Ambulatory Visit: Payer: Self-pay | Admitting: Physician Assistant

## 2017-12-08 DIAGNOSIS — E1142 Type 2 diabetes mellitus with diabetic polyneuropathy: Secondary | ICD-10-CM

## 2017-12-08 DIAGNOSIS — Z794 Long term (current) use of insulin: Principal | ICD-10-CM

## 2017-12-22 DIAGNOSIS — M65331 Trigger finger, right middle finger: Secondary | ICD-10-CM | POA: Diagnosis not present

## 2017-12-22 DIAGNOSIS — G5602 Carpal tunnel syndrome, left upper limb: Secondary | ICD-10-CM | POA: Diagnosis not present

## 2017-12-22 DIAGNOSIS — G5601 Carpal tunnel syndrome, right upper limb: Secondary | ICD-10-CM | POA: Diagnosis not present

## 2017-12-31 ENCOUNTER — Other Ambulatory Visit: Payer: Self-pay | Admitting: Physician Assistant

## 2017-12-31 DIAGNOSIS — E7800 Pure hypercholesterolemia, unspecified: Secondary | ICD-10-CM

## 2018-01-04 ENCOUNTER — Encounter: Payer: Self-pay | Admitting: Podiatry

## 2018-01-04 ENCOUNTER — Ambulatory Visit: Payer: BLUE CROSS/BLUE SHIELD | Admitting: Podiatry

## 2018-01-04 DIAGNOSIS — D229 Melanocytic nevi, unspecified: Secondary | ICD-10-CM | POA: Diagnosis not present

## 2018-01-04 NOTE — Progress Notes (Signed)
Subjective:  Patient ID: Jessica Elliott, female    DOB: 1956-07-24,  MRN: [id number] HPI Chief Complaint  Patient presents with  . Skin Problem    Between 4th and 5th toes left - patient states she is diabetic, last a1c 8.4, and her pedicurist pointed out a darkened spot between her toes, not sore, but concerned being diabetic    62 y.o. female presents with the above complaint.     Past Medical History:  Diagnosis Date  . Allergic rhinitis, seasonal   . Diabetes mellitus without complication ([place])   . Hyperlipidemia   . Hypertension   . Polyp of colon   . Sleep apnea    Past Surgical History:  Procedure Laterality Date  . ABDOMINAL HYSTERECTOMY  1995   still have 1 ovary  . bone spur     removed from right shoulder  . COLONOSCOPY WITH PROPOFOL N/A 11/22/2017   Procedure: COLONOSCOPY WITH PROPOFOL;  Surgeon: Lucilla Lame, MD;  Location: [facility] ENDOSCOPY;  Service: Endoscopy;  Laterality: N/A;  . SHOULDER ARTHROSCOPY    . SHOULDER SURGERY Right 2007    Current Outpatient Medications:  .  hydrochlorothiazide (HYDRODIURIL) 12.5 MG tablet, Take by mouth., Disp: , Rfl:  .  Insulin Glargine (TOUJEO SOLOSTAR) 300 UNIT/ML SOPN, INJECT 70 UNITS EVERY NIGHT AS DIRECTED, Disp: , Rfl:  .  albuterol (PROVENTIL HFA;VENTOLIN HFA) 108 (90 BASE) MCG/ACT inhaler, Inhale 2 puffs into the lungs every 6 (six) hours as needed., Disp: , Rfl:  .  ALPRAZolam (XANAX) 0.5 MG tablet, TAKE 1/2 TO 1 TABLET 3 TIMES DAILY AS NEEDED FOR ANXIETY, Disp: 30 tablet, Rfl: 5 .  aspirin 81 MG tablet, Take 81 mg by mouth daily., Disp: , Rfl:  .  baclofen (LIORESAL) 10 MG tablet, Take by mouth., Disp: , Rfl:  .  fluticasone (FLONASE) 50 MCG/ACT nasal spray, Place 2 sprays into both nostrils daily., Disp: 16 g, Rfl: 6 .  furosemide (LASIX) 20 MG tablet, TAKE ONE TABLET BY MOUTH EVERY DAY, Disp: 30 tablet, Rfl: 6 .  HYDROcodone-acetaminophen (NORCO) 10-325 MG tablet, Take 1 tablet by mouth every 6 (six) hours as  needed., Disp: 30 tablet, Rfl: 0 .  IBU 800 MG tablet, TAKE ONE TABLET BY MOUTH EVERY 8 HOURS AS NEEDED, Disp: 45 tablet, Rfl: 3 .  insulin aspart (NOVOLOG) 100 UNIT/ML injection, Inject into the skin. 16-18-20 three times daily, Disp: , Rfl:  .  Insulin Glargine-Lixisenatide 100-33 UNT-MCG/ML SOPN, Inject into the skin. Inject 30 Units subcutaneously once daily. Adjust dose up by 2 units daily until fasting sugar less than 150, as directed. Take in morning., Disp: , Rfl:  .  lisinopril (PRINIVIL,ZESTRIL) 5 MG tablet, , Disp: , Rfl: 10 .  loratadine (CLARITIN) 10 MG tablet, Take 1 tablet by mouth daily. Reported on 01/02/2016, Disp: , Rfl:  .  lovastatin (MEVACOR) 40 MG tablet, TAKE ONE TABLET AT BEDTIME, Disp: 90 tablet, Rfl: 1 .  metFORMIN (GLUCOPHAGE) 1000 MG tablet, Take 1000 mg in am and 1500 mg at bedtime, Disp: , Rfl:  .  Multiple Vitamin (MULTI-VITAMINS) TABS, Take by mouth., Disp: , Rfl:  .  Na Sulfate-K Sulfate-Mg Sulf (SUPREP BOWEL PREP KIT) 17.5-3.13-1.6 GM/177ML SOLN, Take 1 kit by mouth as directed., Disp: 1 Bottle, Rfl: 0 .  nortriptyline (PAMELOR) 10 MG capsule, , Disp: , Rfl: 1 .  nystatin (MYCOSTATIN) 100000 UNIT/ML suspension, , Disp: , Rfl: 0 .  omeprazole (PRILOSEC) 20 MG capsule, Take by mouth., Disp: , Rfl:  .  ONETOUCH VERIO test strip, , Disp: , Rfl: 10 .  potassium chloride (K-DUR) 10 MEQ tablet, Take 1 tablet (10 mEq total) by mouth daily. (Patient not taking: Reported on 11/22/2017), Disp: 30 tablet, Rfl: 11 .  ULTICARE SHORT PEN NEEDLES 31G X 8 MM MISC, USE AS DIRECTED FOUR TIMES DAILY, Disp: 200 each, Rfl: 5 .  Vitamin D, Ergocalciferol, (DRISDOL) 50000 units CAPS capsule, Take 1 capsule (50,000 Units total) by mouth every 7 (seven) days., Disp: 4 capsule, Rfl: 3  Allergies  Allergen Reactions  . Ibuprofen Other (See Comments)    Drowsy  . Lisinopril Other (See Comments)    hematuria  . Statins Other (See Comments)  . Codeine Hives  . Penicillins Hives   Review  of Systems  All other systems reviewed and are negative.  Objective:  There were no vitals filed for this visit.  General: Well developed, nourished, in no acute distress, alert and oriented x3   Dermatological: Skin is warm, dry and supple bilateral. Nails x 10 are well maintained; remaining integument appears unremarkable at this time. There are no open sores, no preulcerative lesions, no rash or signs of infection present.  Small nevus on the medial aspect of the fifth digit left foot near the sulcus.  There is less than 3 mm in diameter margins appear to be normal it is not raised color is homogeneous and it is nontender.  She has noticed no bleeding from this lesion.  Vascular: Dorsalis Pedis artery and Posterior Tibial artery pedal pulses are 2/4 bilateral with immedate capillary fill time. Pedal hair growth present. No varicosities and no lower extremity edema present bilateral.   Neruologic: Grossly intact via light touch bilateral. Vibratory intact via tuning fork bilateral. Protective threshold with Semmes Wienstein monofilament intact to all pedal sites bilateral. Patellar and Achilles deep tendon reflexes 2+ bilateral. No Babinski or clonus noted bilateral.   Musculoskeletal: No gross boney pedal deformities bilateral. No pain, crepitus, or limitation noted with foot and ankle range of motion bilateral. Muscular strength 5/5 in all groups tested bilateral.  Gait: Unassisted, Nonantalgic.    Radiographs:  None take  Assessment & Plan:   Assessment: Benign nevus medial aspect fifth digit left foot does not demonstrate characteristics of dysplasia.  Plan: Recommended that for second opinion she follow-up with her dermatologist.     Yenesis Even T. [place], [place]

## 2018-01-10 DIAGNOSIS — G5603 Carpal tunnel syndrome, bilateral upper limbs: Secondary | ICD-10-CM | POA: Diagnosis not present

## 2018-01-16 DIAGNOSIS — E1165 Type 2 diabetes mellitus with hyperglycemia: Secondary | ICD-10-CM | POA: Diagnosis not present

## 2018-01-16 DIAGNOSIS — Z794 Long term (current) use of insulin: Secondary | ICD-10-CM | POA: Diagnosis not present

## 2018-01-17 DIAGNOSIS — G5603 Carpal tunnel syndrome, bilateral upper limbs: Secondary | ICD-10-CM | POA: Insufficient documentation

## 2018-01-23 DIAGNOSIS — E1165 Type 2 diabetes mellitus with hyperglycemia: Secondary | ICD-10-CM | POA: Diagnosis not present

## 2018-01-23 DIAGNOSIS — I10 Essential (primary) hypertension: Secondary | ICD-10-CM | POA: Diagnosis not present

## 2018-01-23 DIAGNOSIS — E1129 Type 2 diabetes mellitus with other diabetic kidney complication: Secondary | ICD-10-CM | POA: Diagnosis not present

## 2018-01-25 DIAGNOSIS — D485 Neoplasm of uncertain behavior of skin: Secondary | ICD-10-CM | POA: Diagnosis not present

## 2018-01-25 DIAGNOSIS — D2272 Melanocytic nevi of left lower limb, including hip: Secondary | ICD-10-CM | POA: Diagnosis not present

## 2018-03-04 ENCOUNTER — Other Ambulatory Visit: Payer: Self-pay | Admitting: Physician Assistant

## 2018-03-04 DIAGNOSIS — F411 Generalized anxiety disorder: Secondary | ICD-10-CM

## 2018-03-30 ENCOUNTER — Other Ambulatory Visit: Payer: Self-pay | Admitting: Physician Assistant

## 2018-05-01 DIAGNOSIS — Z794 Long term (current) use of insulin: Secondary | ICD-10-CM | POA: Diagnosis not present

## 2018-05-01 DIAGNOSIS — E1165 Type 2 diabetes mellitus with hyperglycemia: Secondary | ICD-10-CM | POA: Diagnosis not present

## 2018-05-03 ENCOUNTER — Telehealth: Payer: Self-pay | Admitting: Physician Assistant

## 2018-05-03 NOTE — Telephone Encounter (Signed)
Patient called saying that she thinks that she may have shingles. She reports that she has a rash that is red and painful. She has a sick husband and does not want to expose him of shingles. She has not used anything OTC. She denies any shortness of breath or wheezing. Appt was scheduled for evaluation.

## 2018-05-04 ENCOUNTER — Encounter: Payer: Self-pay | Admitting: Physician Assistant

## 2018-05-04 ENCOUNTER — Ambulatory Visit: Payer: BLUE CROSS/BLUE SHIELD | Admitting: Physician Assistant

## 2018-05-04 VITALS — BP 120/60 | HR 84 | Temp 97.7°F | Resp 16 | Wt 208.0 lb

## 2018-05-04 DIAGNOSIS — R1011 Right upper quadrant pain: Secondary | ICD-10-CM

## 2018-05-04 DIAGNOSIS — B027 Disseminated zoster: Secondary | ICD-10-CM | POA: Diagnosis not present

## 2018-05-04 DIAGNOSIS — K2900 Acute gastritis without bleeding: Secondary | ICD-10-CM | POA: Diagnosis not present

## 2018-05-04 DIAGNOSIS — I872 Venous insufficiency (chronic) (peripheral): Secondary | ICD-10-CM | POA: Diagnosis not present

## 2018-05-04 MED ORDER — OMEPRAZOLE 40 MG PO CPDR: 40.0000 mg | DELAYED_RELEASE_CAPSULE | Freq: Every day | ORAL | 3 refills | Status: DC

## 2018-05-04 MED ORDER — VALACYCLOVIR HCL 1 G PO TABS: 1000.0000 mg | ORAL_TABLET | Freq: Three times a day (TID) | ORAL | 0 refills | Status: DC

## 2018-05-04 NOTE — Patient Instructions (Signed)
Shingles Shingles, which is also known as herpes zoster, is an infection that causes a painful skin rash and fluid-filled blisters. Shingles is not related to genital herpes, which is a sexually transmitted infection. Shingles only develops in people who:  Have had chickenpox.  Have received the chickenpox vaccine. (This is rare.)  What are the causes? Shingles is caused by varicella-zoster virus (VZV). This is the same virus that causes chickenpox. After exposure to VZV, the virus stays in the body in an inactive (dormant) state. Shingles develops if the virus reactivates. This can happen many years after the initial exposure to VZV. It is not known what causes this virus to reactivate. What increases the risk? People who have had chickenpox or received the chickenpox vaccine are at risk for shingles. Infection is more common in people who:  Are older than age 50.  Have a weakened defense (immune) system, such as those with HIV, AIDS, or cancer.  Are taking medicines that weaken the immune system, such as transplant medicines.  Are under great stress.  What are the signs or symptoms? Early symptoms of this condition include itching, tingling, and pain in an area on your skin. Pain may be described as burning, stabbing, or throbbing. A few days or weeks after symptoms start, a painful red rash appears, usually on one side of the body in a bandlike or beltlike pattern. The rash eventually turns into fluid-filled blisters that break open, scab over, and dry up in about 2-3 weeks. At any time during the infection, you may also develop:  A fever.  Chills.  A headache.  An upset stomach.  How is this diagnosed? This condition is diagnosed with a skin exam. Sometimes, skin or fluid samples are taken from the blisters before a diagnosis is made. These samples are examined under a microscope or sent to a lab for testing. How is this treated? There is no specific cure for this condition.  Your health care provider will probably prescribe medicines to help you manage pain, recover more quickly, and avoid long-term problems. Medicines may include:  Antiviral drugs.  Anti-inflammatory drugs.  Pain medicines.  If the area involved is on your face, you may be referred to a specialist, such as an eye doctor (ophthalmologist) or an ear, nose, and throat (ENT) doctor to help you avoid eye problems, chronic pain, or disability. Follow these instructions at home: Medicines  Take medicines only as directed by your health care provider.  Apply an anti-itch or numbing cream to the affected area as directed by your health care provider. Blister and Rash Care  Take a cool bath or apply cool compresses to the area of the rash or blisters as directed by your health care provider. This may help with pain and itching.  Keep your rash covered with a loose bandage (dressing). Wear loose-fitting clothing to help ease the pain of material rubbing against the rash.  Keep your rash and blisters clean with mild soap and cool water or as directed by your health care provider.  Check your rash every day for signs of infection. These include redness, swelling, and pain that lasts or increases.  Do not pick your blisters.  Do not scratch your rash. General instructions  Rest as directed by your health care provider.  Keep all follow-up visits as directed by your health care provider. This is important.  Until your blisters scab over, your infection can cause chickenpox in people who have never had it or been vaccinated   against it. To prevent this from happening, avoid contact with other people, especially: ? Babies. ? Pregnant women. ? Children who have eczema. ? Elderly people who have transplants. ? People who have chronic illnesses, such as leukemia or AIDS. Contact a health care provider if:  Your pain is not relieved with prescribed medicines.  Your pain does not get better after  the rash heals.  Your rash looks infected. Signs of infection include redness, swelling, and pain that lasts or increases. Get help right away if:  The rash is on your face or nose.  You have facial pain, pain around your eye area, or loss of feeling on one side of your face.  You have ear pain or you have ringing in your ear.  You have loss of taste.  Your condition gets worse. This information is not intended to replace advice given to you by your health care provider. Make sure you discuss any questions you have with your health care provider. Document Released: 12/06/2005 Document Revised: 08/01/2016 Document Reviewed: 10/17/2014 Elsevier Interactive Patient Education  2018 [employer] Inc[employer]  

## 2018-05-04 NOTE — Progress Notes (Signed)
Patient: Jessica Elliott Female    DOB: Apr 29, 1956   62 y.o.   MRN: [id number] Visit Date: 05/04/2018  Today's Provider: Mar Daring, PA-C   Chief Complaint  Patient presents with  . Rash   Subjective:    HPI  Patient here today C/O rash on right leg since Tuesday. Patient reports rash is painful. Patient denies using any medications on rash. Patient denies using any new lotions, soaps, or clothing.   Patient C/O possible gallbladder pain. Patient reports pain on and off x's several months. Patient denies nausea or vomiting reports some heart burn.   Patient reports leg pain "heaviness" mostly at night x's years. Patient reports not taking any thing for pain.      Allergies  Allergen Reactions  . Ibuprofen Other (See Comments)    Drowsy  . Lisinopril Other (See Comments)    hematuria  . Statins Other (See Comments)  . Codeine Hives  . Penicillins Hives     Current Outpatient Medications:  .  albuterol (PROVENTIL HFA;VENTOLIN HFA) 108 (90 BASE) MCG/ACT inhaler, Inhale 2 puffs into the lungs every 6 (six) hours as needed., Disp: , Rfl:  .  ALPRAZolam (XANAX) 0.5 MG tablet, ONE-HALF TO ONE TABLET 3 TIMES DAILY AS NEEDED FOR ANXIETY, Disp: 90 tablet, Rfl: 5 .  aspirin 81 MG tablet, Take 81 mg by mouth daily., Disp: , Rfl:  .  baclofen (LIORESAL) 10 MG tablet, Take by mouth., Disp: , Rfl:  .  fluticasone (FLONASE) 50 MCG/ACT nasal spray, Place 2 sprays into both nostrils daily., Disp: 16 g, Rfl: 6 .  furosemide (LASIX) 20 MG tablet, TAKE ONE TABLET BY MOUTH EVERY DAY, Disp: 30 tablet, Rfl: 6 .  hydrochlorothiazide (HYDRODIURIL) 12.5 MG tablet, Take by mouth., Disp: , Rfl:  .  HYDROcodone-acetaminophen (NORCO) 10-325 MG tablet, Take 1 tablet by mouth every 6 (six) hours as needed., Disp: 30 tablet, Rfl: 0 .  IBU 800 MG tablet, TAKE ONE TABLET EVERY EIGHT HOURS AS NEEDED, Disp: 45 tablet, Rfl: 3 .  insulin aspart (NOVOLOG) 100 UNIT/ML injection, Inject into the  skin. 16-18-20 three times daily, Disp: , Rfl:  .  Insulin Glargine (TOUJEO SOLOSTAR) 300 UNIT/ML SOPN, INJECT 70 UNITS EVERY NIGHT AS DIRECTED, Disp: , Rfl:  .  lisinopril (PRINIVIL,ZESTRIL) 5 MG tablet, , Disp: , Rfl: 10 .  loratadine (CLARITIN) 10 MG tablet, Take 1 tablet by mouth daily. Reported on 01/02/2016, Disp: , Rfl:  .  lovastatin (MEVACOR) 40 MG tablet, TAKE ONE TABLET AT BEDTIME, Disp: 90 tablet, Rfl: 1 .  metFORMIN (GLUCOPHAGE) 1000 MG tablet, Take 1000 mg in am and 1500 mg at bedtime, Disp: , Rfl:  .  Multiple Vitamin (MULTI-VITAMINS) TABS, Take by mouth., Disp: , Rfl:  .  nortriptyline (PAMELOR) 10 MG capsule, , Disp: , Rfl: 1 .  ONETOUCH VERIO test strip, , Disp: , Rfl: 10 .  ULTICARE SHORT PEN NEEDLES 31G X 8 MM MISC, USE AS DIRECTED FOUR TIMES DAILY, Disp: 200 each, Rfl: 5 .  Vitamin D, Ergocalciferol, (DRISDOL) 50000 units CAPS capsule, Take 1 capsule (50,000 Units total) by mouth every 7 (seven) days., Disp: 4 capsule, Rfl: 3 .  omeprazole (PRILOSEC) 20 MG capsule, Take by mouth., Disp: , Rfl:   Review of Systems  Constitutional: Negative.   Respiratory: Negative.   Cardiovascular: Negative.   Gastrointestinal: Negative.   Musculoskeletal: Positive for myalgias.  Skin: Positive for rash.    Social History  Tobacco Use  . Smoking status: Former Research scientist (life sciences)  . Smokeless tobacco: Never Used  Substance Use Topics  . Alcohol use: Yes    Alcohol/week: 0.0 oz    Comment: occasionally   Objective:   BP 120/60 (BP Location: Left Arm, Patient Position: Sitting, Cuff Size: Large)   Pulse 84   Temp 97.7 F (36.5 C) (Oral)   Resp 16   Wt 208 lb (94.3 kg)   SpO2 97%   BMI 38.04 kg/m  Vitals:   05/04/18 0832  BP: 120/60  Pulse: 84  Resp: 16  Temp: 97.7 F (36.5 C)  TempSrc: Oral  SpO2: 97%  Weight: 208 lb (94.3 kg)     Physical Exam  Constitutional: She appears well-developed and well-nourished. No distress.  Neck: Normal range of motion. Neck supple. No JVD  present. No tracheal deviation present. No thyromegaly present.  Cardiovascular: Normal rate, regular rhythm and normal heart sounds. Exam reveals no gallop and no friction rub.  No murmur heard. Pulmonary/Chest: Effort normal and breath sounds normal. No respiratory distress. She has no wheezes. She has no rales.  Abdominal: Soft. Normal appearance and bowel sounds are normal. She exhibits no distension and no ascites. There is tenderness in the right upper quadrant and epigastric area. There is no rebound, no guarding, no tenderness at McBurney's point and negative Murphy's sign.  Lymphadenopathy:    She has no cervical adenopathy.  Skin: Rash noted. Rash is vesicular. She is not diaphoretic.     Vitals reviewed.       Assessment & Plan:     1. Disseminated herpes zoster Will treat with valtrex as below.  - valACYclovir (VALTREX) 1000 MG tablet; Take 1 tablet (1,000 mg total) by mouth 3 (three) times daily.  Dispense: 21 tablet; Refill: 0  2. RUQ pain DDx: gallstones, gastritis. Has been under more stress recently due to Husband's health. Will get [place] to r/o gallstones. Will start omeprazole back as noted below incase it is gastritis. Will stop treatment if gallstones present once we receive [place] results.  - US Abdomen Limited RUQ; Future  3. Other acute gastritis without hemorrhage See above medical treatment plan. - omeprazole (PRILOSEC) 40 MG capsule; Take 1 capsule (40 mg total) by mouth daily.  Dispense: 30 capsule; Refill: 3  4. Venous insufficiency Discussed compression therapy, elevating legs and using tylenol prn for pain.       Mar Daring, PA-C  [facility]

## 2018-05-09 DIAGNOSIS — E1165 Type 2 diabetes mellitus with hyperglycemia: Secondary | ICD-10-CM | POA: Diagnosis not present

## 2018-05-09 DIAGNOSIS — I10 Essential (primary) hypertension: Secondary | ICD-10-CM | POA: Diagnosis not present

## 2018-05-09 DIAGNOSIS — E1129 Type 2 diabetes mellitus with other diabetic kidney complication: Secondary | ICD-10-CM | POA: Diagnosis not present

## 2018-05-12 ENCOUNTER — Telehealth: Payer: Self-pay

## 2018-05-12 ENCOUNTER — Ambulatory Visit
Admission: RE | Admit: 2018-05-12 | Discharge: 2018-05-12 | Disposition: A | Discharge status: Home / Self Care | Payer: BLUE CROSS/BLUE SHIELD | Source: Ambulatory Visit | Attending: Physician Assistant | Admitting: Physician Assistant

## 2018-05-12 ENCOUNTER — Ambulatory Visit: Admission: RE | Admit: 2018-05-12 | Payer: BLUE CROSS/BLUE SHIELD | Source: Ambulatory Visit

## 2018-05-12 DIAGNOSIS — R1011 Right upper quadrant pain: Secondary | ICD-10-CM | POA: Insufficient documentation

## 2018-05-12 DIAGNOSIS — K7689 Other specified diseases of liver: Secondary | ICD-10-CM | POA: Diagnosis not present

## 2018-05-12 NOTE — Telephone Encounter (Signed)
Viewed by Jacqulyn Liner on 05/12/2018 11:09 AM

## 2018-05-12 NOTE — Telephone Encounter (Signed)
-----   Message from Mar Daring, PA-C sent at 05/12/2018 10:14 AM EDT ----- Gallbladder normal, no stones or infection. However, a small benign, non-obstructing polyp in the gallbaldder was noted. Fatty liver also noted with no focal lesion. Fatty liver is often seen. Best way to help fatty liver is to change dietary habits and avoid fatty foods or foods with high cholesterol.

## 2018-08-08 DIAGNOSIS — Z794 Long term (current) use of insulin: Secondary | ICD-10-CM | POA: Diagnosis not present

## 2018-08-08 DIAGNOSIS — E1165 Type 2 diabetes mellitus with hyperglycemia: Secondary | ICD-10-CM | POA: Diagnosis not present

## 2018-08-15 DIAGNOSIS — E1165 Type 2 diabetes mellitus with hyperglycemia: Secondary | ICD-10-CM | POA: Diagnosis not present

## 2018-08-15 DIAGNOSIS — E1129 Type 2 diabetes mellitus with other diabetic kidney complication: Secondary | ICD-10-CM | POA: Diagnosis not present

## 2018-08-15 DIAGNOSIS — E1169 Type 2 diabetes mellitus with other specified complication: Secondary | ICD-10-CM | POA: Diagnosis not present

## 2018-08-15 DIAGNOSIS — I10 Essential (primary) hypertension: Secondary | ICD-10-CM | POA: Diagnosis not present

## 2018-09-29 DIAGNOSIS — Z23 Encounter for immunization: Secondary | ICD-10-CM | POA: Diagnosis not present

## 2018-10-26 ENCOUNTER — Other Ambulatory Visit: Payer: Self-pay | Admitting: Physician Assistant

## 2018-10-26 DIAGNOSIS — F411 Generalized anxiety disorder: Secondary | ICD-10-CM

## 2018-11-01 ENCOUNTER — Other Ambulatory Visit: Payer: Self-pay | Admitting: Physician Assistant

## 2018-11-01 DIAGNOSIS — Z1231 Encounter for screening mammogram for malignant neoplasm of breast: Secondary | ICD-10-CM

## 2018-11-09 ENCOUNTER — Ambulatory Visit
Admission: RE | Admit: 2018-11-09 | Discharge: 2018-11-09 | Disposition: A | Discharge status: Home / Self Care | Payer: BLUE CROSS/BLUE SHIELD | Source: Ambulatory Visit | Attending: Physician Assistant | Admitting: Physician Assistant

## 2018-11-09 DIAGNOSIS — Z1231 Encounter for screening mammogram for malignant neoplasm of breast: Secondary | ICD-10-CM | POA: Insufficient documentation

## 2018-11-10 ENCOUNTER — Telehealth: Payer: Self-pay

## 2018-11-10 DIAGNOSIS — I208 Other forms of angina pectoris: Secondary | ICD-10-CM | POA: Diagnosis not present

## 2018-11-10 DIAGNOSIS — G5603 Carpal tunnel syndrome, bilateral upper limbs: Secondary | ICD-10-CM | POA: Diagnosis not present

## 2018-11-10 DIAGNOSIS — R0602 Shortness of breath: Secondary | ICD-10-CM | POA: Diagnosis not present

## 2018-11-10 DIAGNOSIS — E11649 Type 2 diabetes mellitus with hypoglycemia without coma: Secondary | ICD-10-CM | POA: Diagnosis not present

## 2018-11-10 NOTE — Telephone Encounter (Signed)
Patient advised-JER

## 2018-11-10 NOTE — Telephone Encounter (Signed)
-----   Message from Mar Daring, PA-C sent at 11/10/2018  8:21 AM EST ----- Normal mammogram. Repeat screening in one year.

## 2018-11-10 NOTE — Telephone Encounter (Signed)
LVMTRC 

## 2018-11-13 ENCOUNTER — Other Ambulatory Visit: Payer: Self-pay | Admitting: Physician Assistant

## 2018-11-14 DIAGNOSIS — I208 Other forms of angina pectoris: Secondary | ICD-10-CM | POA: Diagnosis not present

## 2018-11-14 DIAGNOSIS — Z794 Long term (current) use of insulin: Secondary | ICD-10-CM | POA: Diagnosis not present

## 2018-11-14 DIAGNOSIS — E1129 Type 2 diabetes mellitus with other diabetic kidney complication: Secondary | ICD-10-CM | POA: Diagnosis not present

## 2018-11-14 DIAGNOSIS — R809 Proteinuria, unspecified: Secondary | ICD-10-CM | POA: Diagnosis not present

## 2018-11-14 DIAGNOSIS — R0602 Shortness of breath: Secondary | ICD-10-CM | POA: Diagnosis not present

## 2018-11-21 DIAGNOSIS — Z794 Long term (current) use of insulin: Secondary | ICD-10-CM | POA: Diagnosis not present

## 2018-11-21 DIAGNOSIS — E1165 Type 2 diabetes mellitus with hyperglycemia: Secondary | ICD-10-CM | POA: Diagnosis not present

## 2018-11-21 DIAGNOSIS — I10 Essential (primary) hypertension: Secondary | ICD-10-CM | POA: Diagnosis not present

## 2018-11-21 DIAGNOSIS — E1169 Type 2 diabetes mellitus with other specified complication: Secondary | ICD-10-CM | POA: Diagnosis not present

## 2018-11-21 DIAGNOSIS — E1129 Type 2 diabetes mellitus with other diabetic kidney complication: Secondary | ICD-10-CM | POA: Diagnosis not present

## 2018-11-23 DIAGNOSIS — I208 Other forms of angina pectoris: Secondary | ICD-10-CM | POA: Diagnosis not present

## 2018-11-23 DIAGNOSIS — R0602 Shortness of breath: Secondary | ICD-10-CM | POA: Diagnosis not present

## 2018-12-09 ENCOUNTER — Other Ambulatory Visit: Payer: Self-pay | Admitting: Physician Assistant

## 2018-12-09 DIAGNOSIS — R600 Localized edema: Secondary | ICD-10-CM

## 2018-12-14 DIAGNOSIS — E113393 Type 2 diabetes mellitus with moderate nonproliferative diabetic retinopathy without macular edema, bilateral: Secondary | ICD-10-CM | POA: Diagnosis not present

## 2018-12-14 LAB — HM DIABETES EYE EXAM

## 2018-12-27 ENCOUNTER — Encounter: Payer: Self-pay | Admitting: *Deleted

## 2019-01-01 ENCOUNTER — Encounter: Payer: Self-pay | Admitting: Family Medicine

## 2019-01-01 ENCOUNTER — Ambulatory Visit: Payer: BLUE CROSS/BLUE SHIELD | Admitting: Family Medicine

## 2019-01-01 VITALS — BP 125/78 | HR 80 | Temp 97.9°F | Wt 209.2 lb

## 2019-01-01 DIAGNOSIS — L0291 Cutaneous abscess, unspecified: Secondary | ICD-10-CM

## 2019-01-01 DIAGNOSIS — J4520 Mild intermittent asthma, uncomplicated: Secondary | ICD-10-CM | POA: Diagnosis not present

## 2019-01-01 DIAGNOSIS — B9789 Other viral agents as the cause of diseases classified elsewhere: Secondary | ICD-10-CM | POA: Diagnosis not present

## 2019-01-01 DIAGNOSIS — J069 Acute upper respiratory infection, unspecified: Secondary | ICD-10-CM | POA: Diagnosis not present

## 2019-01-01 MED ORDER — SULFAMETHOXAZOLE-TRIMETHOPRIM 800-160 MG PO TABS: 1.0000 | ORAL_TABLET | Freq: Two times a day (BID) | ORAL | 0 refills | Status: AC

## 2019-01-01 MED ORDER — ALBUTEROL SULFATE HFA 108 (90 BASE) MCG/ACT IN AERS: 1.0000 | INHALATION_SPRAY | Freq: Four times a day (QID) | RESPIRATORY_TRACT | 1 refills | Status: DC | PRN

## 2019-01-01 NOTE — Progress Notes (Signed)
Patient: Jessica Elliott Female    DOB: 1956-01-17   63 y.o.   MRN: [id number] Visit Date: 01/01/2019  Today's Provider: Lavon Paganini, MD   Chief Complaint  Patient presents with  . URI   Subjective:    I, Tiburcio Pea, CMA, am acting as a scribe for Lavon Paganini, MD.   URI   This is a new problem. Episode onset: Saturday. The problem has been gradually worsening. Maximum temperature: Patient states she felt "feverish" but did not take her temperature  Associated symptoms include congestion, coughing, neck pain, a plugged ear sensation, rhinorrhea and sneezing. Associated symptoms comments: Body aches . She has tried acetaminophen (Robitussin ) for the symptoms.   She did note a large lesion on L neck that was red, TTP.  She applied warm compresses and it drained a significant amount of pus.   Allergies  Allergen Reactions  . Ibuprofen Other (See Comments)    Drowsy  . Lisinopril Other (See Comments)    hematuria  . Statins Other (See Comments)  . Codeine Hives  . Penicillins Hives     Current Outpatient Medications:  .  albuterol (PROVENTIL HFA;VENTOLIN HFA) 108 (90 BASE) MCG/ACT inhaler, Inhale 2 puffs into the lungs every 6 (six) hours as needed., Disp: , Rfl:  .  ALPRAZolam (XANAX) 0.5 MG tablet, ONE-HALF TO ONE TABLET BY MOUTH 3 TIMES DAILY AS NEEDED FOR ANXIETY, Disp: 90 tablet, Rfl: 5 .  aspirin 81 MG tablet, Take 81 mg by mouth daily., Disp: , Rfl:  .  baclofen (LIORESAL) 10 MG tablet, Take by mouth., Disp: , Rfl:  .  fluticasone (FLONASE) 50 MCG/ACT nasal spray, Place 2 sprays into both nostrils daily., Disp: 16 g, Rfl: 6 .  furosemide (LASIX) 20 MG tablet, TAKE ONE TABLET EVERY DAY, Disp: 30 tablet, Rfl: 6 .  HYDROcodone-acetaminophen (NORCO) 10-325 MG tablet, Take 1 tablet by mouth every 6 (six) hours as needed., Disp: 30 tablet, Rfl: 0 .  IBU 800 MG tablet, TAKE ONE TABLET EVERY EIGHT HOURS AS NEEDED, Disp: 45 tablet, Rfl: 3 .  insulin aspart  (NOVOLOG) 100 UNIT/ML injection, Inject into the skin. 16-18-20 three times daily, Disp: , Rfl:  .  Insulin Glargine (TOUJEO SOLOSTAR) 300 UNIT/ML SOPN, INJECT 70 UNITS EVERY NIGHT AS DIRECTED, Disp: , Rfl:  .  lisinopril (PRINIVIL,ZESTRIL) 5 MG tablet, , Disp: , Rfl: 10 .  loratadine (CLARITIN) 10 MG tablet, Take 1 tablet by mouth daily. Reported on 01/02/2016, Disp: , Rfl:  .  lovastatin (MEVACOR) 40 MG tablet, TAKE ONE TABLET AT BEDTIME, Disp: 90 tablet, Rfl: 1 .  metFORMIN (GLUCOPHAGE) 1000 MG tablet, Take 1000 mg in am and 1500 mg at bedtime, Disp: , Rfl:  .  Multiple Vitamin (MULTI-VITAMINS) TABS, Take by mouth., Disp: , Rfl:  .  nortriptyline (PAMELOR) 10 MG capsule, , Disp: , Rfl: 1 .  omeprazole (PRILOSEC) 40 MG capsule, Take 1 capsule (40 mg total) by mouth daily., Disp: 30 capsule, Rfl: 3 .  ONETOUCH VERIO test strip, , Disp: , Rfl: 10 .  ULTICARE SHORT PEN NEEDLES 31G X 8 MM MISC, USE AS DIRECTED FOUR TIMES DAILY, Disp: 200 each, Rfl: 5 .  valACYclovir (VALTREX) 1000 MG tablet, Take 1 tablet (1,000 mg total) by mouth 3 (three) times daily., Disp: 21 tablet, Rfl: 0 .  Vitamin D, Ergocalciferol, (DRISDOL) 1.25 MG (50000 UT) CAPS capsule, TAKE 1 CAPSULE EVERY WEEK, Disp: 4 capsule, Rfl: 5 .  hydrochlorothiazide (HYDRODIURIL)  12.5 MG tablet, Take by mouth., Disp: , Rfl:   Review of Systems  Constitutional: Negative.   HENT: Positive for congestion, rhinorrhea and sneezing.   Respiratory: Positive for cough.   Cardiovascular: Negative.   Musculoskeletal: Positive for neck pain.    Social History   Tobacco Use  . Smoking status: Former Research scientist (life sciences)  . Smokeless tobacco: Never Used  Substance Use Topics  . Alcohol use: Yes    Alcohol/week: 0.0 standard drinks    Comment: occasionally      Objective:   BP 125/78 (BP Location: Right Arm, Patient Position: Sitting, Cuff Size: Large)   Pulse 80   Temp 97.9 F (36.6 C) (Oral)   Wt 209 lb 3.2 oz (94.9 kg)   SpO2 97%   BMI 38.26  kg/m  Vitals:   01/01/19 1510  BP: 125/78  Pulse: 80  Temp: 97.9 F (36.6 C)  TempSrc: Oral  SpO2: 97%  Weight: 209 lb 3.2 oz (94.9 kg)     Physical Exam Vitals signs reviewed.  Constitutional:      General: She is not in acute distress.    Appearance: She is not diaphoretic.  HENT:     Head: Normocephalic and atraumatic.     Right Ear: Tympanic membrane, ear canal and external ear normal.     Left Ear: Tympanic membrane, ear canal and external ear normal.     Nose: Congestion and rhinorrhea present.     Mouth/Throat:     Mouth: Mucous membranes are moist.     Pharynx: No oropharyngeal exudate or posterior oropharyngeal erythema.  Eyes:     General: No scleral icterus.    Conjunctiva/sclera: Conjunctivae normal.     Pupils: Pupils are equal, round, and reactive to light.  Neck:     Musculoskeletal: Neck supple.  Cardiovascular:     Rate and Rhythm: Normal rate and regular rhythm.     Pulses: Normal pulses.     Heart sounds: Normal heart sounds. No murmur.  Pulmonary:     Effort: Pulmonary effort is normal. No respiratory distress.     Breath sounds: Wheezing (minimal end expiratory scattered wheeze) present. No rhonchi.  Abdominal:     General: There is no distension.     Palpations: Abdomen is soft.     Tenderness: There is no abdominal tenderness.  Lymphadenopathy:     Cervical: No cervical adenopathy.  Skin:    General: Skin is warm and dry.     Capillary Refill: Capillary refill takes less than 2 seconds.     Findings: No rash.     Comments: 1cm abscess without fluctuance on L side of neck with redness and tenderness to palpation  Neurological:     Mental Status: She is alert and oriented to person, place, and time.     Cranial Nerves: No cranial nerve deficit.  Psychiatric:        Mood and Affect: Mood normal.        Behavior: Behavior normal.        Assessment & Plan   1. Abscess - new problem - suspect this is the cause of her chills and  fatigue - it is open and draining and does not require I&D - will treat with Bactrim - discussed return precautions   2. Viral URI with cough - symptoms and exam c/w viral URI - no evidence of strep pharyngitis, CAP, AOM, bacterial sinusitis, or other bacterial infection - discussed symptomatic management, natural course, and return precautions  3. Mild intermittent extrinsic asthma without complication - chronic - currently with minimal wheeze associated with viral URI - no need for prednisone at this time - can use albuterol prn - discussed return precautions    Meds ordered this encounter  Medications  . sulfamethoxazole-trimethoprim (BACTRIM DS,SEPTRA DS) 800-160 MG tablet    Sig: Take 1 tablet by mouth 2 (two) times daily for 7 days.    Dispense:  14 tablet    Refill:  0  . albuterol (PROVENTIL HFA;VENTOLIN HFA) 108 (90 Base) MCG/ACT inhaler    Sig: Inhale 1-2 puffs into the lungs every 6 (six) hours as needed.    Dispense:  1 Inhaler    Refill:  1     Return if symptoms worsen or fail to improve.   The entirety of the information documented in the History of Present Illness, Review of Systems and Physical Exam were personally obtained by me. Portions of this information were initially documented by Tiburcio Pea, CMA and reviewed by me for thoroughness and accuracy.    Virginia Crews, MD, MPH [facility] 01/02/2019 11:00 AM

## 2019-01-01 NOTE — Patient Instructions (Signed)
Skin Abscess  A skin abscess is an infected area on or under your skin that contains a collection of pus and other material. An abscess may also be called a furuncle, carbuncle, or boil. An abscess can occur in or on almost any part of your body. Some abscesses break open (rupture) on their own. Most continue to get worse unless they are treated. The infection can spread deeper into the body and eventually into your blood, which can make you feel ill. Treatment usually involves draining the abscess. What are the causes? An abscess occurs when germs, like bacteria, pass through your skin and cause an infection. This may be caused by:  A scrape or cut on your skin.  A puncture wound through your skin, including a needle injection or insect bite.  Blocked oil or sweat glands.  Blocked and infected hair follicles.  A cyst that forms beneath your skin (sebaceous cyst) and becomes infected. What increases the risk? This condition is more likely to develop in people who:  Have a weak body defense system (immune system).  Have diabetes.  Have dry and irritated skin.  Get frequent injections or use illegal IV drugs.  Have a foreign body in a wound, such as a splinter.  Have problems with their lymph system or veins. What are the signs or symptoms? Symptoms of this condition include:  A painful, firm bump under the skin.  A bump with pus at the top. This may break through the skin and drain. Other symptoms include:  Redness surrounding the abscess site.  Warmth.  Swelling of the lymph nodes (glands) near the abscess.  Tenderness.  A sore on the skin. How is this diagnosed? This condition may be diagnosed based on:  A physical exam.  Your medical history.  A sample of pus. This may be used to find out what is causing the infection.  Blood tests.  Imaging tests, such as an ultrasound, CT scan, or MRI. How is this treated? A small abscess that drains on its own may not  need treatment. Treatment for larger abscesses may include:  Moist heat or heat pack applied to the area several times a day.  A procedure to drain the abscess (incision and drainage).  Antibiotic medicines. For a severe abscess, you may first get antibiotics through an IV and then change to antibiotics by mouth. Follow these instructions at home: Medicines   Take over-the-counter and prescription medicines only as told by your health care provider.  If you were prescribed an antibiotic medicine, take it as told by your health care provider. Do not stop taking the antibiotic even if you start to feel better. Abscess care   If you have an abscess that has not drained, apply heat to the affected area. Use the heat source that your health care provider recommends, such as a moist heat pack or a heating pad. ? Place a towel between your skin and the heat source. ? Leave the heat on for 20-30 minutes. ? Remove the heat if your skin turns bright red. This is especially important if you are unable to feel pain, heat, or cold. You may have a greater risk of getting burned.  Follow instructions from your health care provider about how to take care of your abscess. Make sure you: ? Cover the abscess with a bandage (dressing). ? Change your dressing or gauze as told by your health care provider. ? Wash your hands with soap and water before you change the   dressing or gauze. If soap and water are not available, use hand sanitizer.  Check your abscess every day for signs of a worsening infection. Check for: ? More redness, swelling, or pain. ? More fluid or blood. ? Warmth. ? More pus or a bad smell. General instructions  To avoid spreading the infection: ? Do not share personal care items, towels, or hot tubs with others. ? Avoid making skin contact with other people.  Keep all follow-up visits as told by your health care provider. This is important. Contact a health care provider if you  have:  More redness, swelling, or pain around your abscess.  More fluid or blood coming from your abscess.  Warm skin around your abscess.  More pus or a bad smell coming from your abscess.  A fever.  Muscle aches.  Chills or a general ill feeling. Get help right away if you:  Have severe pain.  See red streaks on your skin spreading away from the abscess. Summary  A skin abscess is an infected area on or under your skin that contains a collection of pus and other material.  A small abscess that drains on its own may not need treatment.  Treatment for larger abscesses may include having a procedure to drain the abscess and taking an antibiotic. This information is not intended to replace advice given to you by your health care provider. Make sure you discuss any questions you have with your health care provider. Document Released: 09/15/2005 Document Revised: 01/19/2018 Document Reviewed: 01/19/2018 Elsevier Interactive Patient Education  2019 Elsevier Inc.  

## 2019-01-11 ENCOUNTER — Telehealth: Payer: Self-pay | Admitting: Physician Assistant

## 2019-01-11 NOTE — Telephone Encounter (Signed)
We need to evaluate her.  Recommend OV scheduled.  A lot of pink eye is viral or there could be a more serious infection.  Have to see the patient to tease this out.

## 2019-01-11 NOTE — Telephone Encounter (Signed)
Pt states she was seen last week for a cold and now has pink eye.  She is requesting eye drops be called into the pharmacy -[facility] . Pt was informed an appt may be needed and she said she would prefer a message be sent back since she was seen last week.

## 2019-01-11 NOTE — Telephone Encounter (Signed)
Left message advising patient to call back to schedule a appointment for evaluation.

## 2019-01-15 DIAGNOSIS — H168 Other keratitis: Secondary | ICD-10-CM | POA: Diagnosis not present

## 2019-01-17 DIAGNOSIS — G4733 Obstructive sleep apnea (adult) (pediatric): Secondary | ICD-10-CM | POA: Diagnosis not present

## 2019-01-17 DIAGNOSIS — H168 Other keratitis: Secondary | ICD-10-CM | POA: Diagnosis not present

## 2019-01-18 DIAGNOSIS — H168 Other keratitis: Secondary | ICD-10-CM | POA: Diagnosis not present

## 2019-01-22 DIAGNOSIS — B0052 Herpesviral keratitis: Secondary | ICD-10-CM | POA: Diagnosis not present

## 2019-01-24 DIAGNOSIS — G4733 Obstructive sleep apnea (adult) (pediatric): Secondary | ICD-10-CM | POA: Diagnosis not present

## 2019-01-29 DIAGNOSIS — B0052 Herpesviral keratitis: Secondary | ICD-10-CM | POA: Diagnosis not present

## 2019-02-01 DIAGNOSIS — B0052 Herpesviral keratitis: Secondary | ICD-10-CM | POA: Diagnosis not present

## 2019-02-19 ENCOUNTER — Other Ambulatory Visit: Payer: Self-pay | Admitting: Physician Assistant

## 2019-02-19 DIAGNOSIS — E1142 Type 2 diabetes mellitus with diabetic polyneuropathy: Secondary | ICD-10-CM

## 2019-02-19 DIAGNOSIS — Z794 Long term (current) use of insulin: Principal | ICD-10-CM

## 2019-05-01 DIAGNOSIS — I208 Other forms of angina pectoris: Secondary | ICD-10-CM | POA: Diagnosis not present

## 2019-05-01 DIAGNOSIS — R0602 Shortness of breath: Secondary | ICD-10-CM | POA: Diagnosis not present

## 2019-05-01 DIAGNOSIS — G5603 Carpal tunnel syndrome, bilateral upper limbs: Secondary | ICD-10-CM | POA: Diagnosis not present

## 2019-05-01 DIAGNOSIS — E11649 Type 2 diabetes mellitus with hypoglycemia without coma: Secondary | ICD-10-CM | POA: Diagnosis not present

## 2019-05-04 ENCOUNTER — Other Ambulatory Visit: Payer: Self-pay | Admitting: Physician Assistant

## 2019-05-04 DIAGNOSIS — F411 Generalized anxiety disorder: Secondary | ICD-10-CM

## 2019-05-07 NOTE — Telephone Encounter (Signed)
Please review

## 2019-05-07 NOTE — Telephone Encounter (Signed)
LMTCB to schedule a OV with Tawanna Sat

## 2019-05-07 NOTE — Telephone Encounter (Signed)
Will give 30 tabs.  She is overdue for f/u with Tawanna Sat for anxiety. Please have her schedule f/u to get further refills

## 2019-05-19 ENCOUNTER — Other Ambulatory Visit: Payer: Self-pay | Admitting: Physician Assistant

## 2019-06-28 DIAGNOSIS — H168 Other keratitis: Secondary | ICD-10-CM | POA: Diagnosis not present

## 2019-07-05 ENCOUNTER — Other Ambulatory Visit: Payer: Self-pay | Admitting: Physician Assistant

## 2019-07-05 DIAGNOSIS — H168 Other keratitis: Secondary | ICD-10-CM | POA: Diagnosis not present

## 2019-07-05 DIAGNOSIS — R600 Localized edema: Secondary | ICD-10-CM

## 2019-07-05 MED ORDER — FUROSEMIDE 20 MG PO TABS: 20.0000 mg | ORAL_TABLET | Freq: Every day | ORAL | 6 refills | Status: DC

## 2019-07-05 NOTE — Telephone Encounter (Signed)
[facility] faxed refill request for the following medications:  furosemide (LASIX) 20 MG tablet    Please advise.  

## 2019-07-19 DIAGNOSIS — H168 Other keratitis: Secondary | ICD-10-CM | POA: Diagnosis not present

## 2019-10-11 DIAGNOSIS — Z794 Long term (current) use of insulin: Secondary | ICD-10-CM | POA: Diagnosis not present

## 2019-10-11 DIAGNOSIS — E1129 Type 2 diabetes mellitus with other diabetic kidney complication: Secondary | ICD-10-CM | POA: Diagnosis not present

## 2019-10-11 DIAGNOSIS — R809 Proteinuria, unspecified: Secondary | ICD-10-CM | POA: Diagnosis not present

## 2019-10-15 DIAGNOSIS — E113293 Type 2 diabetes mellitus with mild nonproliferative diabetic retinopathy without macular edema, bilateral: Secondary | ICD-10-CM | POA: Diagnosis not present

## 2019-10-15 DIAGNOSIS — Z794 Long term (current) use of insulin: Secondary | ICD-10-CM | POA: Diagnosis not present

## 2019-10-15 DIAGNOSIS — E1165 Type 2 diabetes mellitus with hyperglycemia: Secondary | ICD-10-CM | POA: Diagnosis not present

## 2019-10-15 DIAGNOSIS — E1129 Type 2 diabetes mellitus with other diabetic kidney complication: Secondary | ICD-10-CM | POA: Diagnosis not present

## 2019-10-15 DIAGNOSIS — E1122 Type 2 diabetes mellitus with diabetic chronic kidney disease: Secondary | ICD-10-CM | POA: Diagnosis not present

## 2019-10-29 ENCOUNTER — Other Ambulatory Visit: Payer: Self-pay | Admitting: Internal Medicine

## 2019-10-29 DIAGNOSIS — Z1231 Encounter for screening mammogram for malignant neoplasm of breast: Secondary | ICD-10-CM

## 2019-11-20 ENCOUNTER — Ambulatory Visit
Admission: RE | Admit: 2019-11-20 | Discharge: 2019-11-20 | Disposition: A | Discharge status: Home / Self Care | Payer: BC Managed Care – PPO | Source: Ambulatory Visit | Attending: Internal Medicine | Admitting: Internal Medicine

## 2019-11-20 DIAGNOSIS — Z1231 Encounter for screening mammogram for malignant neoplasm of breast: Secondary | ICD-10-CM | POA: Diagnosis not present

## 2019-11-21 ENCOUNTER — Telehealth: Payer: Self-pay | Admitting: Physician Assistant

## 2019-11-21 ENCOUNTER — Other Ambulatory Visit: Payer: Self-pay | Admitting: Internal Medicine

## 2019-11-21 DIAGNOSIS — R928 Other abnormal and inconclusive findings on diagnostic imaging of breast: Secondary | ICD-10-CM

## 2019-11-21 NOTE — Telephone Encounter (Signed)
Patient advised.

## 2019-11-21 NOTE — Telephone Encounter (Signed)
[facility] advised Pt to call Pcp and have an order sent to have a re scan for a mammogram asap/ please advise Pt when order has been sent

## 2019-11-21 NOTE — Telephone Encounter (Signed)
Orders placed.

## 2019-11-21 NOTE — Telephone Encounter (Signed)
From [facility] 

## 2019-11-28 ENCOUNTER — Other Ambulatory Visit: Payer: Self-pay | Admitting: Physician Assistant

## 2019-11-28 ENCOUNTER — Ambulatory Visit
Admission: RE | Admit: 2019-11-28 | Discharge: 2019-11-28 | Disposition: A | Discharge status: Home / Self Care | Payer: BC Managed Care – PPO | Source: Ambulatory Visit | Attending: Physician Assistant | Admitting: Physician Assistant

## 2019-11-28 DIAGNOSIS — R921 Mammographic calcification found on diagnostic imaging of breast: Secondary | ICD-10-CM | POA: Diagnosis not present

## 2019-11-28 DIAGNOSIS — R928 Other abnormal and inconclusive findings on diagnostic imaging of breast: Secondary | ICD-10-CM

## 2019-11-29 ENCOUNTER — Other Ambulatory Visit: Payer: Self-pay | Admitting: Physician Assistant

## 2019-11-29 DIAGNOSIS — R928 Other abnormal and inconclusive findings on diagnostic imaging of breast: Secondary | ICD-10-CM

## 2019-11-29 DIAGNOSIS — R921 Mammographic calcification found on diagnostic imaging of breast: Secondary | ICD-10-CM

## 2019-12-05 ENCOUNTER — Ambulatory Visit
Admission: RE | Admit: 2019-12-05 | Discharge: 2019-12-05 | Disposition: A | Discharge status: Home / Self Care | Payer: BC Managed Care – PPO | Source: Ambulatory Visit | Attending: Physician Assistant | Admitting: Physician Assistant

## 2019-12-05 ENCOUNTER — Other Ambulatory Visit: Payer: Self-pay

## 2019-12-05 DIAGNOSIS — R921 Mammographic calcification found on diagnostic imaging of breast: Secondary | ICD-10-CM | POA: Diagnosis not present

## 2019-12-05 DIAGNOSIS — R928 Other abnormal and inconclusive findings on diagnostic imaging of breast: Secondary | ICD-10-CM

## 2019-12-05 HISTORY — PX: BREAST BIOPSY: SHX20

## 2019-12-06 LAB — SURGICAL PATHOLOGY

## 2020-01-07 ENCOUNTER — Other Ambulatory Visit: Payer: Self-pay | Admitting: Physician Assistant

## 2020-01-16 DIAGNOSIS — Z794 Long term (current) use of insulin: Secondary | ICD-10-CM | POA: Diagnosis not present

## 2020-01-16 DIAGNOSIS — E1129 Type 2 diabetes mellitus with other diabetic kidney complication: Secondary | ICD-10-CM | POA: Diagnosis not present

## 2020-01-16 DIAGNOSIS — R809 Proteinuria, unspecified: Secondary | ICD-10-CM | POA: Diagnosis not present

## 2020-01-22 DIAGNOSIS — Z794 Long term (current) use of insulin: Secondary | ICD-10-CM | POA: Diagnosis not present

## 2020-01-22 DIAGNOSIS — E113293 Type 2 diabetes mellitus with mild nonproliferative diabetic retinopathy without macular edema, bilateral: Secondary | ICD-10-CM | POA: Diagnosis not present

## 2020-01-22 DIAGNOSIS — E1129 Type 2 diabetes mellitus with other diabetic kidney complication: Secondary | ICD-10-CM | POA: Diagnosis not present

## 2020-01-22 DIAGNOSIS — E1122 Type 2 diabetes mellitus with diabetic chronic kidney disease: Secondary | ICD-10-CM | POA: Diagnosis not present

## 2020-01-22 DIAGNOSIS — E1165 Type 2 diabetes mellitus with hyperglycemia: Secondary | ICD-10-CM | POA: Diagnosis not present

## 2020-01-24 IMAGING — MG DIGITAL SCREENING BILATERAL MAMMOGRAM WITH TOMO AND CAD
6 of 10 series · 6 of 30 positions shown · non-contrast
Comparison: Previous exam(s).

CLINICAL DATA: Screening.

EXAM:
DIGITAL SCREENING BILATERAL MAMMOGRAM WITH TOMO AND CAD

[L CC synth-2D]
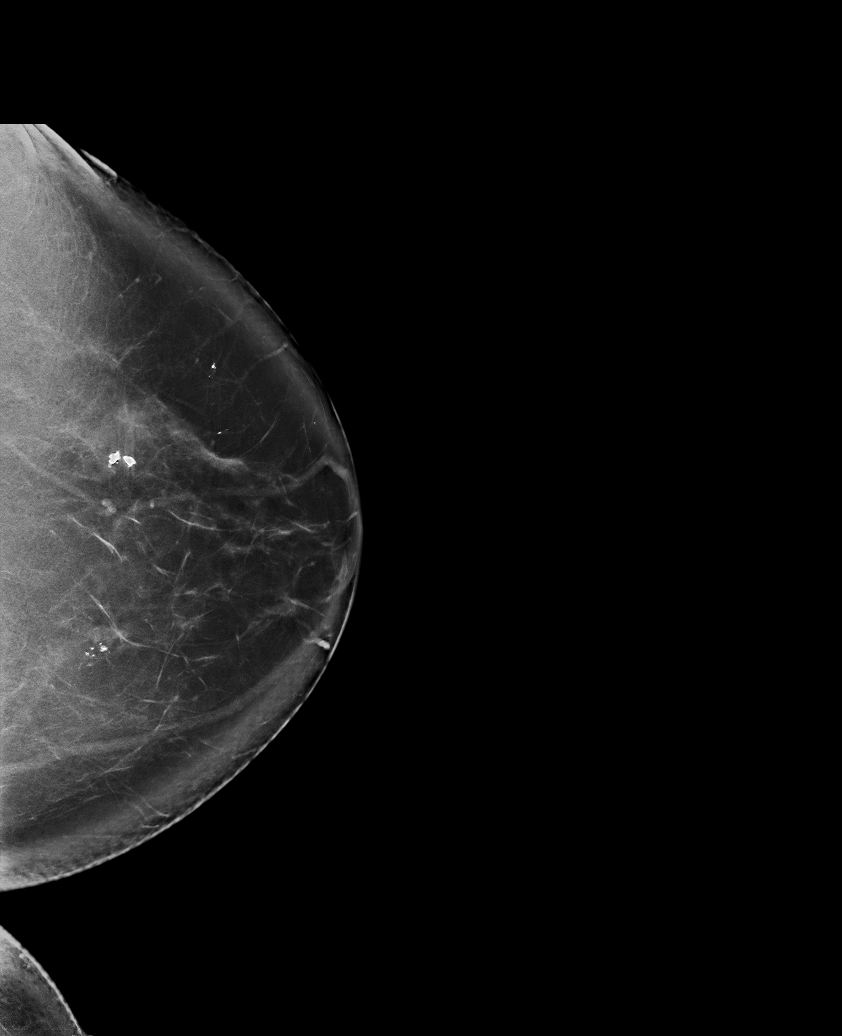

[L MLO synth-2D (1 of 2)]
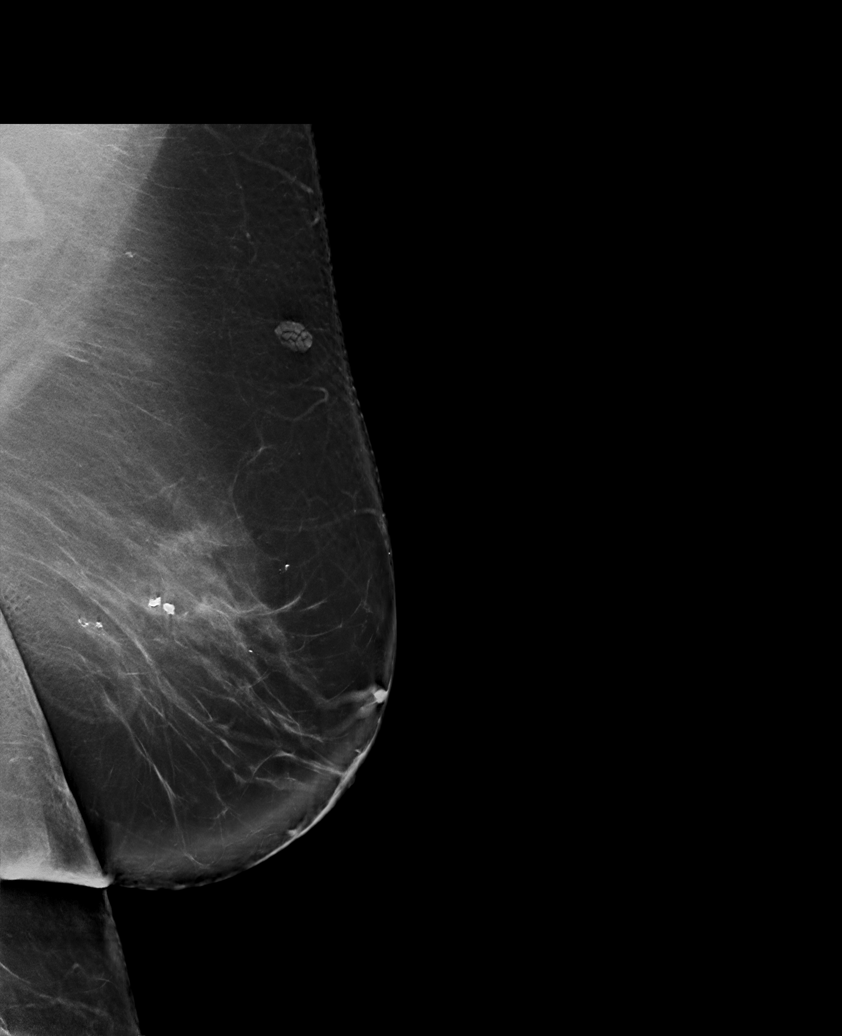

[L MLO synth-2D (2 of 2)]
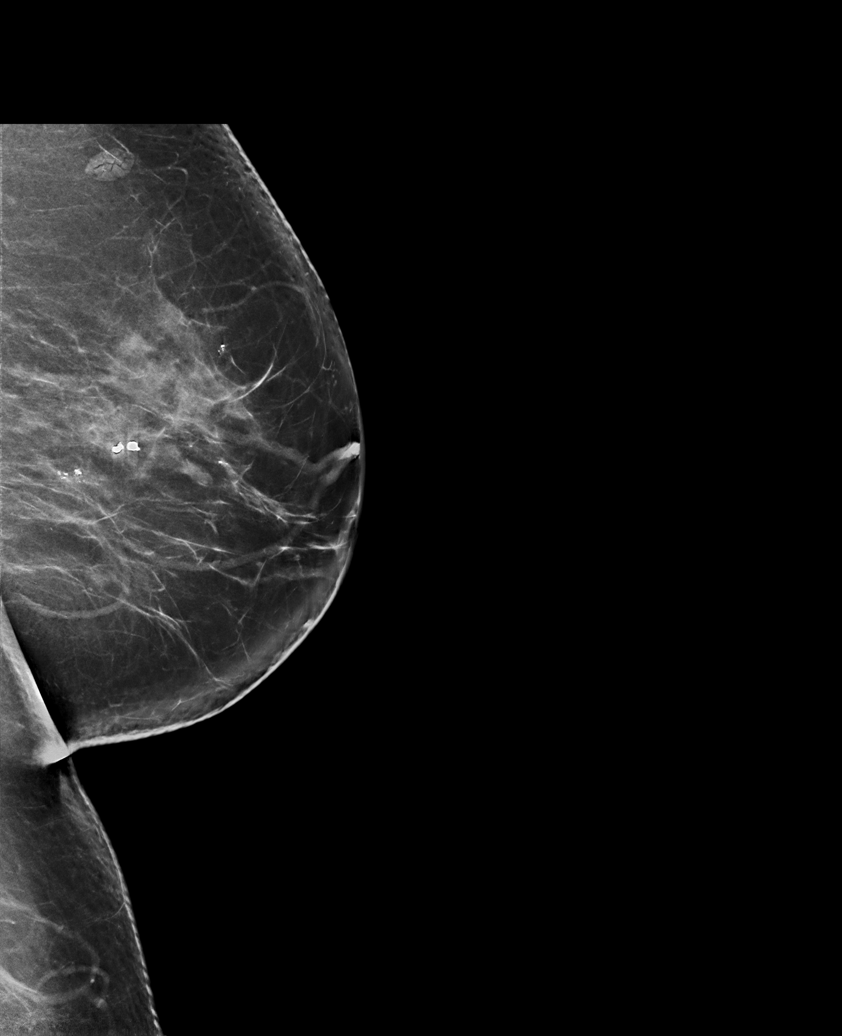

[R MLO synth-2D]
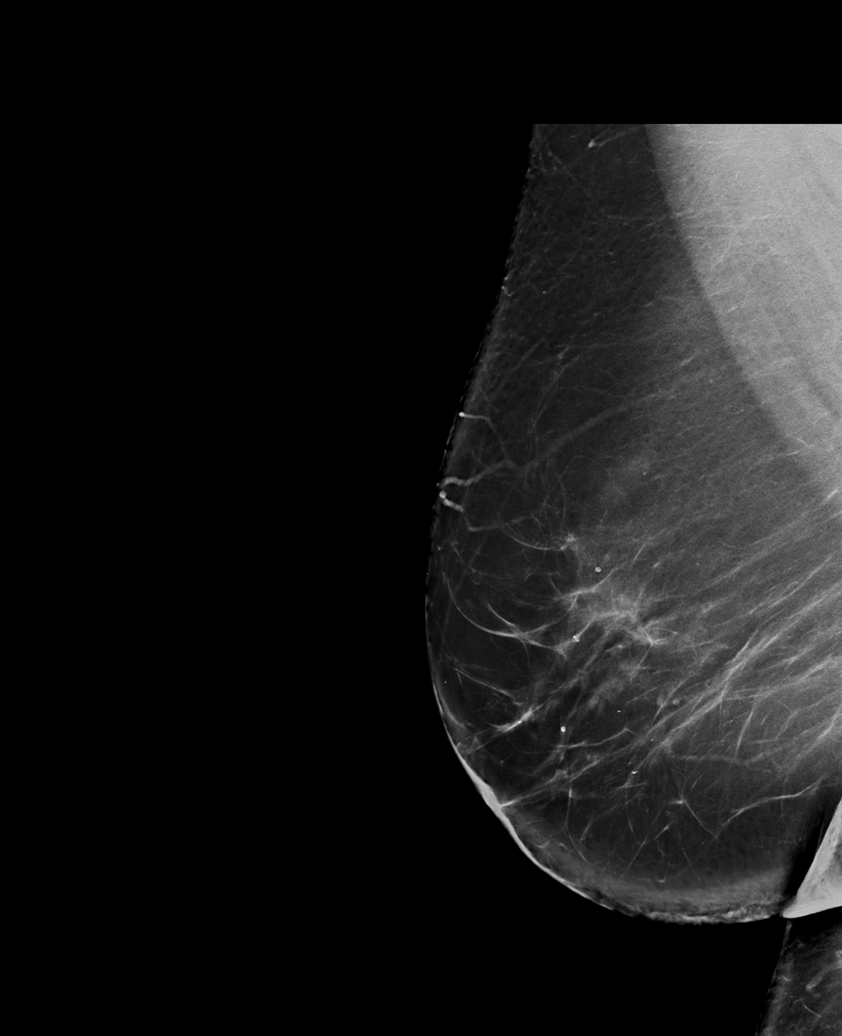

[R CC synth-2D]
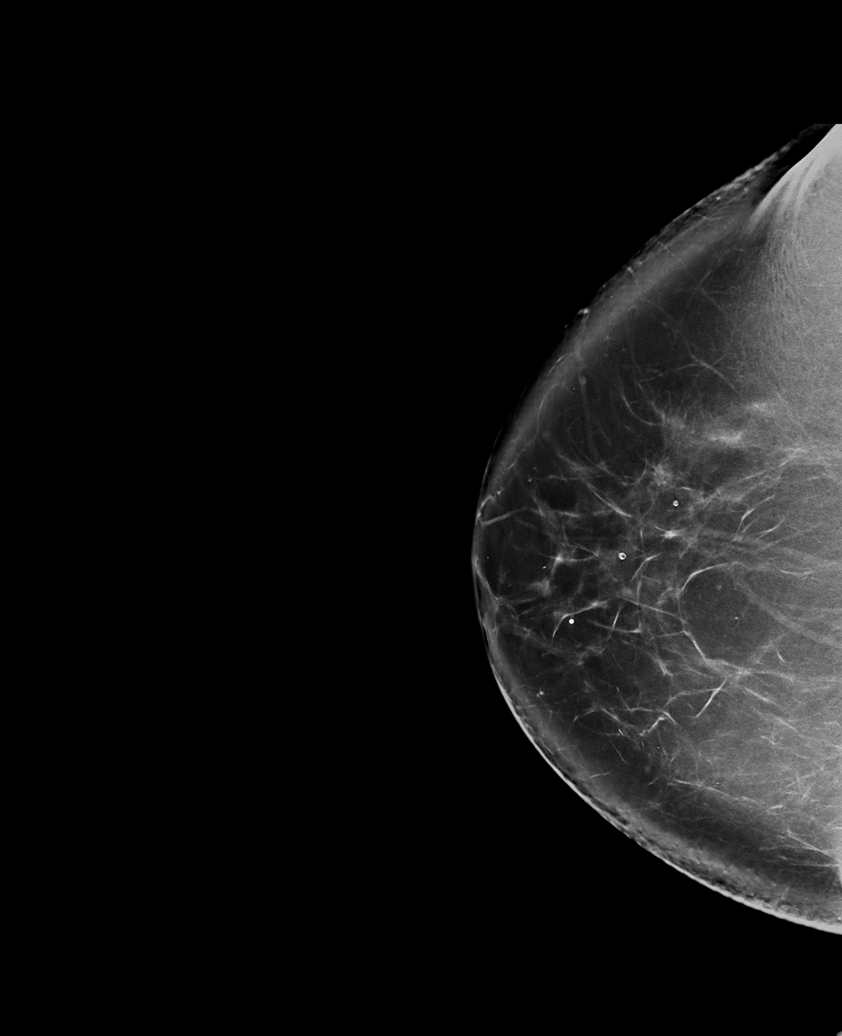

[L CC tomo · tomo slice 55/110.0]
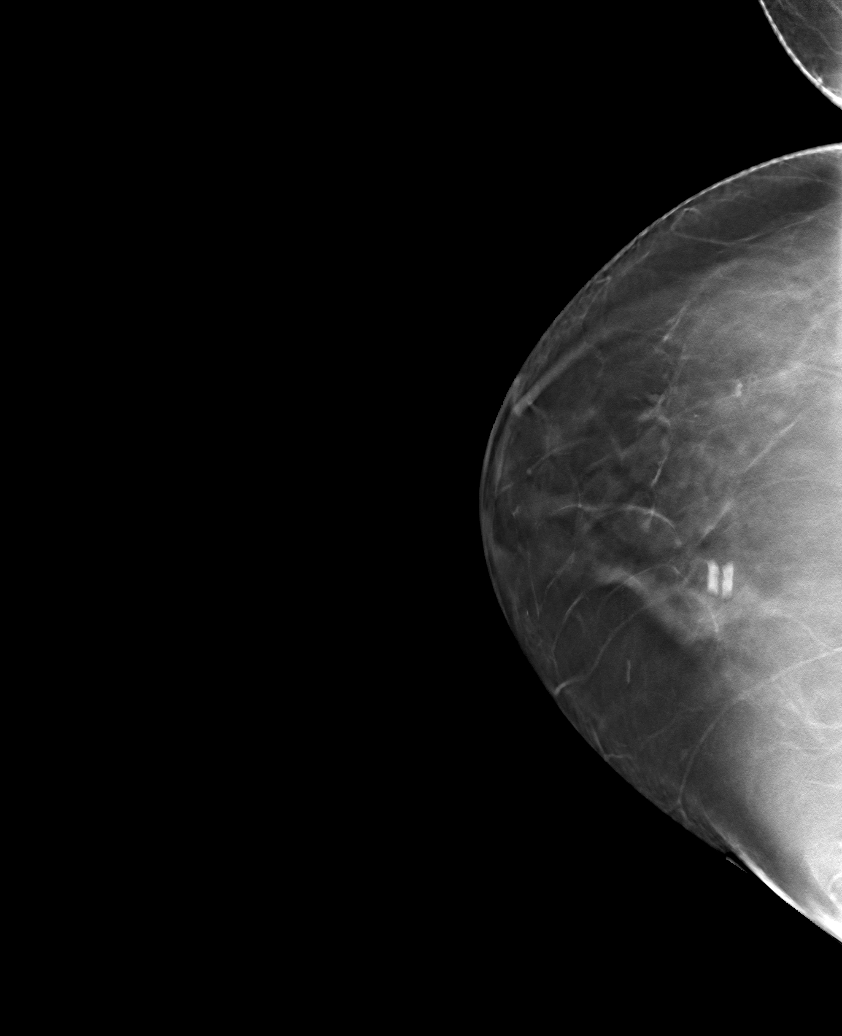

[6 of 30 positions shown; findings below may reference images not displayed]

ACR Breast Density Category b: There are scattered areas of
fibroglandular density.
FINDINGS: There are no findings suspicious for malignancy. Images were
processed with CAD.
IMPRESSION: No mammographic evidence of malignancy. A result letter of this
screening mammogram will be mailed directly to the patient.

RECOMMENDATION:
Screening mammogram in one year. (Code:[id number])

BI-RADS CATEGORY  1: Negative.

## 2020-02-01 ENCOUNTER — Other Ambulatory Visit: Payer: Self-pay | Admitting: Physician Assistant

## 2020-02-01 NOTE — Telephone Encounter (Signed)
Requested medication (s) are due for refill today: yes  Requested medication (s) are on the active medication list: yes  Last refill:  01/07/20  Future visit scheduled: no  Notes to clinic: no valid encounter within last 12 months    Requested Prescriptions  Pending Prescriptions Disp Refills   Vitamin D, Ergocalciferol, (DRISDOL) 1.25 MG (50000 UNIT) CAPS capsule [Pharmacy Med Name: VITAMIN D (ERGOCALCIFEROL) 1.25 MG] 4 capsule 5    Sig: TAKE 1 CAPSULE BY MOUTH EACH WEEK      Endocrinology:  Vitamins - Vitamin D Supplementation Failed - 02/01/2020  1:15 PM      Failed - 50,000 IU strengths are not delegated      Failed - Ca in normal range and within 360 days    Calcium  Date Value Ref Range Status  10/10/2015 9.9 8.7 - 10.2 mg/dL Final          Failed - Phosphate in normal range and within 360 days    No results found for: PHOS        Failed - Vitamin D in normal range and within 360 days    Vit D, 25-Hydroxy  Date Value Ref Range Status  07/09/2015 30.0 30.0 - 100.0 ng/mL Final    Comment:    Vitamin D deficiency has been defined by the Institute of Medicine and an Endocrine Society practice guideline as a level of serum 25-OH vitamin D less than 20 ng/mL (1,2). The Endocrine Society went on to further define vitamin D insufficiency as a level between 21 and 29 ng/mL (2). 1. IOM (Institute of Medicine). 2010. Dietary reference    intakes for calcium and D. [place]: The    [employer]. 2. Holick MF, Binkley [place], Bischoff-Ferrari HA, et al.    Evaluation, treatment, and prevention of vitamin D    deficiency: an Endocrine Society clinical practice    guideline. JCEM. 2011 Jul; 96(7):1911-30.           Failed - Valid encounter within last 12 months    Recent Outpatient Visits           1 year ago [place], Dionne Bucy, [place]   1 year ago Disseminated herpes zoster   [place],  Clearnce Sorrel, [place]   2 years ago Obstructive sleep apnea syndrome   [facility] [place], Clearnce Sorrel, [place]   3 years ago Annual physical exam   [place], [place]   3 years ago Elbow pain, chronic, right   [facility], [place], [place]

## 2020-04-02 ENCOUNTER — Other Ambulatory Visit: Payer: Self-pay | Admitting: Physician Assistant

## 2020-04-02 DIAGNOSIS — R600 Localized edema: Secondary | ICD-10-CM

## 2020-04-30 DIAGNOSIS — E113293 Type 2 diabetes mellitus with mild nonproliferative diabetic retinopathy without macular edema, bilateral: Secondary | ICD-10-CM | POA: Diagnosis not present

## 2020-04-30 DIAGNOSIS — E1129 Type 2 diabetes mellitus with other diabetic kidney complication: Secondary | ICD-10-CM | POA: Diagnosis not present

## 2020-04-30 DIAGNOSIS — E1165 Type 2 diabetes mellitus with hyperglycemia: Secondary | ICD-10-CM | POA: Diagnosis not present

## 2020-04-30 DIAGNOSIS — Z794 Long term (current) use of insulin: Secondary | ICD-10-CM | POA: Diagnosis not present

## 2020-04-30 DIAGNOSIS — E1169 Type 2 diabetes mellitus with other specified complication: Secondary | ICD-10-CM | POA: Diagnosis not present

## 2020-04-30 DIAGNOSIS — E1122 Type 2 diabetes mellitus with diabetic chronic kidney disease: Secondary | ICD-10-CM | POA: Diagnosis not present

## 2020-04-30 LAB — HEMOGLOBIN A1C: Hemoglobin A1C: 7.4

## 2020-05-03 ENCOUNTER — Other Ambulatory Visit: Payer: Self-pay | Admitting: Physician Assistant

## 2020-05-03 DIAGNOSIS — R600 Localized edema: Secondary | ICD-10-CM

## 2020-05-03 NOTE — Telephone Encounter (Signed)
Requested medication (s) are due for refill today: yes  Requested medication (s) are on the active medication list: yes  Last refill:  04/02/20  Future visit scheduled: no  Notes to clinic:  courtesy refill given previously-called pt and LM on VM to call office to make appt   Requested Prescriptions  Pending Prescriptions Disp Refills   furosemide (LASIX) 20 MG tablet [Pharmacy Med Name: FUROSEMIDE 20 MG TAB] 30 tablet 0    Sig: TAKE 1 TABLET BY MOUTH DAILY      Cardiovascular:  Diuretics - Loop Failed - 05/03/2020 10:44 AM      Failed - K in normal range and within 360 days    Potassium  Date Value Ref Range Status  10/10/2015 3.7 3.5 - 5.2 mmol/L Final    Comment:                  **Please note reference interval change**          Failed - Ca in normal range and within 360 days    Calcium  Date Value Ref Range Status  10/10/2015 9.9 8.7 - 10.2 mg/dL Final          Failed - Na in normal range and within 360 days    Sodium  Date Value Ref Range Status  10/10/2015 141 136 - 144 mmol/L Final    Comment:                  **Please note reference interval change**          Failed - Cr in normal range and within 360 days    Creatinine  Date Value Ref Range Status  10/31/2012 0.73 0.60 - 1.30 mg/dL Final   Creatinine, Ser  Date Value Ref Range Status  10/10/2015 0.69 0.57 - 1.00 mg/dL Final          Failed - Valid encounter within last 6 months    Recent Outpatient Visits           1 year ago [place], Dionne Bucy, MD   2 years ago Disseminated herpes zoster   [place], [place], [place]   3 years ago Obstructive sleep apnea syndrome   [place], Clearnce Sorrel, [place]   3 years ago Annual physical exam   [place], [place]   4 years ago Elbow pain, chronic, right   [place], [place]             Passed - Last BP in normal range    BP Readings from Last 1 Encounters:  01/01/19 125/78

## 2020-05-13 ENCOUNTER — Encounter (INDEPENDENT_AMBULATORY_CARE_PROVIDER_SITE_OTHER): Payer: Self-pay | Admitting: Vascular Surgery

## 2020-05-13 ENCOUNTER — Ambulatory Visit (INDEPENDENT_AMBULATORY_CARE_PROVIDER_SITE_OTHER): Payer: BLUE CROSS/BLUE SHIELD | Admitting: Vascular Surgery

## 2020-05-13 ENCOUNTER — Other Ambulatory Visit: Payer: Self-pay

## 2020-05-13 VITALS — BP 119/68 | HR 99 | Resp 16 | Ht 62.0 in | Wt 211.8 lb

## 2020-05-13 DIAGNOSIS — M79604 Pain in right leg: Secondary | ICD-10-CM

## 2020-05-13 DIAGNOSIS — M79609 Pain in unspecified limb: Secondary | ICD-10-CM | POA: Insufficient documentation

## 2020-05-13 DIAGNOSIS — E785 Hyperlipidemia, unspecified: Secondary | ICD-10-CM

## 2020-05-13 DIAGNOSIS — M79605 Pain in left leg: Secondary | ICD-10-CM

## 2020-05-13 DIAGNOSIS — M7989 Other specified soft tissue disorders: Secondary | ICD-10-CM | POA: Diagnosis not present

## 2020-05-13 DIAGNOSIS — E1142 Type 2 diabetes mellitus with diabetic polyneuropathy: Secondary | ICD-10-CM

## 2020-05-13 DIAGNOSIS — E1165 Type 2 diabetes mellitus with hyperglycemia: Secondary | ICD-10-CM

## 2020-05-13 NOTE — Assessment & Plan Note (Signed)
Neuropathy is likely a major cause of her lower extremity symptoms, but appropriate to check perfusion as well.

## 2020-05-13 NOTE — Patient Instructions (Signed)

## 2020-05-13 NOTE — Assessment & Plan Note (Addendum)

## 2020-05-13 NOTE — Assessment & Plan Note (Signed)
Compression stockings, elevation, and increased activity are all of benefit.  Venous work-up is planned below.

## 2020-05-13 NOTE — Assessment & Plan Note (Signed)
lipid control important in reducing the progression of atherosclerotic disease. Continue statin therapy  

## 2020-05-13 NOTE — Assessment & Plan Note (Signed)
blood glucose control important in reducing the progression of atherosclerotic disease. Also, involved in wound healing. On appropriate medications.  

## 2020-05-13 NOTE — Progress Notes (Signed)
Patient ID: Jessica Elliott, female   DOB: 05/21/1956, 64 y.o.   MRN: [id number]  Chief Complaint  Patient presents with  . New Patient (Initial Visit)    ref Solum ble pain    HPI Jessica Elliott is a 64 y.o. female.  I am asked to see the patient by Dr. Gabriel Carina for evaluation of leg pain.  The pain is most every day.  Walking and activity seems to make the pain worse.  This is associated with swelling.  It was somewhat insidious in its onset over several months but has been present for many months now.  There is no clear cause or inciting event.  Nothing clearly makes this better.  No open ulcerations or infection.  No previous history of DVT or superficial thrombophlebitis to her knowledge.     Past Medical History:  Diagnosis Date  . Allergic rhinitis, seasonal   . Diabetes mellitus without complication ([place])   . Hyperlipidemia   . Hypertension   . Polyp of colon   . Sleep apnea     Past Surgical History:  Procedure Laterality Date  . ABDOMINAL HYSTERECTOMY  1995   still have 1 ovary  . bone spur     removed from right shoulder  . BREAST BIOPSY Right 12/05/2019   stereo bx/ x clip/ path pending  . COLONOSCOPY WITH PROPOFOL N/A 11/22/2017   Procedure: COLONOSCOPY WITH PROPOFOL;  Surgeon: Lucilla Lame, MD;  Location: [facility] ENDOSCOPY;  Service: Endoscopy;  Laterality: N/A;  . SHOULDER ARTHROSCOPY    . SHOULDER SURGERY Right 2007     Family History  Problem Relation Age of Onset  . Emphysema Mother   . Hyperlipidemia Mother   . Diabetes Father   . Aneurysm Father   . Cancer Brother   . Hyperlipidemia Brother   . Pneumonia Brother   . Diabetes Sister   . Hypertension Sister   . Diverticulitis Sister   . Stroke Sister   . Diabetes Sister   . Hypertension Sister   . Heart attack Sister   . Diabetes Sister   . Hypertension Sister   . Cancer Sister   . Heart disease Sister   . Emphysema Sister   . Diabetes Brother   . Hyperlipidemia Brother   . Hypertension  Brother   . Crohn's disease Brother   . Breast cancer Maternal Aunt   . Breast cancer Other      Social History   Tobacco Use  . Smoking status: Former Research scientist (life sciences)  . Smokeless tobacco: Never Used  Substance Use Topics  . Alcohol use: Yes    Alcohol/week: 0.0 standard drinks    Comment: occasionally  . Drug use: No     Allergies  Allergen Reactions  . Ibuprofen Other (See Comments)    Drowsy  . Lisinopril Other (See Comments)    hematuria  . Statins Other (See Comments)  . Codeine Hives  . Penicillins Hives    Current Outpatient Medications  Medication Sig Dispense Refill  . albuterol (PROVENTIL HFA;VENTOLIN HFA) 108 (90 Base) MCG/ACT inhaler Inhale 1-2 puffs into the lungs every 6 (six) hours as needed. 1 Inhaler 1  . aspirin 81 MG tablet Take 81 mg by mouth daily.    . fluticasone (FLONASE) 50 MCG/ACT nasal spray Place 2 sprays into both nostrils daily. (Patient taking differently: Place 2 sprays into both nostrils as needed. ) 16 g 6  . furosemide (LASIX) 20 MG tablet TAKE 1 TABLET BY MOUTH DAILY 90 tablet  1  . insulin aspart (NOVOLOG) 100 UNIT/ML injection Inject into the skin. 16-18-20 three times daily    . Insulin Glargine (TOUJEO SOLOSTAR) 300 UNIT/ML SOPN INJECT 70 UNITS EVERY NIGHT AS DIRECTED    . loratadine (CLARITIN) 10 MG tablet Take 1 tablet by mouth daily. Reported on 01/02/2016    . metFORMIN (GLUCOPHAGE) 1000 MG tablet Take 1000 mg in am and 1500 mg at bedtime    . Multiple Vitamin (MULTI-VITAMINS) TABS Take by mouth.    Marland Kitchen omeprazole (PRILOSEC) 40 MG capsule Take 1 capsule (40 mg total) by mouth daily. 30 capsule 3  . ONETOUCH VERIO test strip   10  . ULTICARE SHORT PEN NEEDLES 31G X 8 MM MISC USE AS DIRECTED FOUR TIMES DAILY 200 each 5  . Vitamin D, Ergocalciferol, (DRISDOL) 1.25 MG (50000 UNIT) CAPS capsule TAKE 1 CAPSULE BY MOUTH EACH WEEK 4 capsule 5  . ALPRAZolam (XANAX) 0.5 MG tablet TAKE 1/2 TO 1 TABLET BY MOUTH 3 TIMES DAILY AS NEEDED FOR ANXIETY  (Patient not taking: Reported on 05/13/2020) 30 tablet 0  . baclofen (LIORESAL) 10 MG tablet Take by mouth.    . hydrochlorothiazide (HYDRODIURIL) 12.5 MG tablet Take by mouth.    Marland Kitchen HYDROcodone-acetaminophen (NORCO) 10-325 MG tablet Take 1 tablet by mouth every 6 (six) hours as needed. (Patient not taking: Reported on 05/13/2020) 30 tablet 0  . IBU 800 MG tablet TAKE ONE TABLET EVERY EIGHT HOURS AS NEEDED (Patient not taking: Reported on 05/13/2020) 45 tablet 3  . lisinopril (PRINIVIL,ZESTRIL) 5 MG tablet   10  . lovastatin (MEVACOR) 40 MG tablet TAKE ONE TABLET AT BEDTIME (Patient not taking: Reported on 05/13/2020) 90 tablet 1  . nortriptyline (PAMELOR) 10 MG capsule   1  . valACYclovir (VALTREX) 1000 MG tablet Take 1 tablet (1,000 mg total) by mouth 3 (three) times daily. (Patient not taking: Reported on 05/13/2020) 21 tablet 0   No current facility-administered medications for this visit.      REVIEW OF SYSTEMS (Negative unless checked)  Constitutional: [] Weight loss  [] Fever  [] Chills Cardiac: [] Chest pain   [] Chest pressure   [] Palpitations   [] Shortness of breath when laying flat   [] Shortness of breath at rest   [] Shortness of breath with exertion. Vascular:  [x] Pain in legs with walking   [x] Pain in legs at rest   [] Pain in legs when laying flat   [] Claudication   [] Pain in feet when walking  [] Pain in feet at rest  [] Pain in feet when laying flat   [] History of DVT   [] Phlebitis   [x] Swelling in legs   [] Varicose veins   [] Non-healing ulcers Pulmonary:   [] Uses home oxygen   [] Productive cough   [] Hemoptysis   [] Wheeze  [] COPD   [] Asthma Neurologic:  [] Dizziness  [] Blackouts   [] Seizures   [] History of stroke   [] History of TIA  [] Aphasia   [] Temporary blindness   [] Dysphagia   [] Weakness or numbness in arms   [] Weakness or numbness in legs Musculoskeletal:  [x] Arthritis   [] Joint swelling   [] Joint pain   [] Low back pain Hematologic:  [] Easy bruising  [] Easy bleeding   [] Hypercoagulable  state   [] Anemic  [] Hepatitis Gastrointestinal:  [] Blood in stool   [] Vomiting blood  [x] Gastroesophageal reflux/heartburn   [] Abdominal pain Genitourinary:  [] Chronic kidney disease   [] Difficult urination  [] Frequent urination  [] Burning with urination   [] Hematuria Skin:  [] Rashes   [] Ulcers   [] Wounds Psychological:  [] History of anxiety   []   History of major depression.    Physical Exam BP 119/68 (BP Location: Right Arm)   Pulse 99   Resp 16   Ht 5\' 2"  (1.575 m)   Wt 211 lb 12.8 oz (96.1 kg)   BMI 38.74 kg/m  Gen:  WD/WN, NAD Head: [place]/AT, No temporalis wasting.  Ear/Nose/Throat: Hearing grossly intact, nares w/o erythema or drainage, oropharynx w/o Erythema/Exudate Eyes: Conjunctiva clear, sclera non-icteric  Neck: trachea midline.  No JVD.  Pulmonary:  Good air movement, respirations not labored, no use of accessory muscles  Cardiac: RRR, no JVD Vascular:  Vessel Right Left  Radial Palpable Palpable                          DP  2+  1+  PT  1+  1+   Gastrointestinal:. No masses, surgical incisions, or scars. Musculoskeletal: M/S 5/5 throughout.  Extremities without ischemic changes.  No deformity or atrophy.  Mild bilateral lower extremity edema. Neurologic: Sensation grossly intact in extremities.  Symmetrical.  Speech is fluent. Motor exam as listed above. Psychiatric: Judgment intact, Mood & affect appropriate for pt's clinical situation. Dermatologic: No rashes or ulcers noted.  No cellulitis or open wounds.    Radiology No results found.  Labs No results found for this or any previous visit (from the past 2160 hour(s)).  Assessment/Plan:  Diabetes mellitus type 2, uncontrolled blood glucose control important in reducing the progression of atherosclerotic disease. Also, involved in wound healing. On appropriate medications.   Diabetic neuropathy Neuropathy is likely a major cause of her lower extremity symptoms, but appropriate to check perfusion as  well.  Hyperlipidemia lipid control important in reducing the progression of atherosclerotic disease. Continue statin therapy   Swelling of limb Compression stockings, elevation, and increased activity are all of benefit.  Venous work-up is planned below.  Pain in limb  Recommend:  The patient has atypical pain symptoms for pure atherosclerotic disease. However, on physical exam there is evidence of mixed venous and arterial disease, given the diminished pulses and the edema associated with venous changes of the legs.  Noninvasive studies including ABI's and venous ultrasound of the legs will be obtained and the patient will follow up with me to review these studies.  The patient should continue walking and begin a more formal exercise program. The patient should continue his antiplatelet therapy and aggressive treatment of the lipid abnormalities.  The patient should begin wearing graduated compression socks 15-20 mmHg strength to control edema.       Leotis Pain 05/13/2020, 4:07 PM   This note was created with Dragon medical transcription system.  Any errors from dictation are unintentional.

## 2020-06-02 ENCOUNTER — Ambulatory Visit: Payer: BC Managed Care – PPO | Admitting: Physician Assistant

## 2020-06-02 ENCOUNTER — Encounter: Payer: Self-pay | Admitting: Physician Assistant

## 2020-06-02 ENCOUNTER — Other Ambulatory Visit: Payer: Self-pay

## 2020-06-02 VITALS — BP 138/77 | HR 86 | Temp 96.6°F | Wt 213.0 lb

## 2020-06-02 DIAGNOSIS — E1165 Type 2 diabetes mellitus with hyperglycemia: Secondary | ICD-10-CM | POA: Diagnosis not present

## 2020-06-02 DIAGNOSIS — E7800 Pure hypercholesterolemia, unspecified: Secondary | ICD-10-CM

## 2020-06-02 DIAGNOSIS — I10 Essential (primary) hypertension: Secondary | ICD-10-CM | POA: Diagnosis not present

## 2020-06-02 DIAGNOSIS — D508 Other iron deficiency anemias: Secondary | ICD-10-CM

## 2020-06-02 DIAGNOSIS — E559 Vitamin D deficiency, unspecified: Secondary | ICD-10-CM

## 2020-06-02 DIAGNOSIS — Z6838 Body mass index (BMI) 38.0-38.9, adult: Secondary | ICD-10-CM

## 2020-06-02 DIAGNOSIS — F411 Generalized anxiety disorder: Secondary | ICD-10-CM

## 2020-06-02 DIAGNOSIS — I152 Hypertension secondary to endocrine disorders: Secondary | ICD-10-CM | POA: Insufficient documentation

## 2020-06-02 DIAGNOSIS — R600 Localized edema: Secondary | ICD-10-CM

## 2020-06-02 DIAGNOSIS — E1142 Type 2 diabetes mellitus with diabetic polyneuropathy: Secondary | ICD-10-CM | POA: Diagnosis not present

## 2020-06-02 DIAGNOSIS — I739 Peripheral vascular disease, unspecified: Secondary | ICD-10-CM

## 2020-06-02 MED ORDER — FUROSEMIDE 20 MG PO TABS: 20.0000 mg | ORAL_TABLET | Freq: Every day | ORAL | 1 refills | Status: DC

## 2020-06-02 MED ORDER — ALPRAZOLAM 0.5 MG PO TABS: ORAL_TABLET | ORAL | 1 refills | Status: DC

## 2020-06-02 MED ORDER — BENAZEPRIL HCL 10 MG PO TABS: 10.0000 mg | ORAL_TABLET | Freq: Every day | ORAL | 3 refills | Status: DC

## 2020-06-02 NOTE — Progress Notes (Signed)
Established patient visit   Patient: Jessica Elliott   DOB: January 27, 1956   64 y.o. Female  MRN: [id number] Visit Date: 06/02/2020  Today's healthcare provider: Mar Daring, PA-C   Chief Complaint  Patient presents with  . Hypertension  . Anxiety  I,Porsha C McClurkin,acting as a scribe for [employer], PA-C.,have documented all relevant documentation on the behalf of Mar Daring, PA-C,as directed by  Mar Daring, PA-C while in the presence of Mar Daring, PA-C.  Subjective    HPI Hypertension, follow-up  BP Readings from Last 3 Encounters:  06/02/20 138/77  05/13/20 119/68  01/01/19 125/78   Wt Readings from Last 3 Encounters:  06/02/20 213 lb (96.6 kg)  05/13/20 211 lb 12.8 oz (96.1 kg)  01/01/19 209 lb 3.2 oz (94.9 kg)     She was last seen for hypertension 6 months ago.  BP at that visit was . Management since that visit includes patient said she stopped taking her HCTZ.  She reports poor compliance with treatment. She is not having side effects.  She is following a Regular diet. She is not exercising. She does not smoke.  Use of agents associated with hypertension: none.   Outside blood pressures are arranges in 130's/70's-80's. Symptoms: No chest pain No chest pressure  No palpitations No syncope  No dyspnea No orthopnea  No paroxysmal nocturnal dyspnea No lower extremity edema   Pertinent labs: Lab Results  Component Value Date   CHOL 133 07/09/2015   HDL 54 07/09/2015   LDLCALC 62 07/09/2015   TRIG 85 07/09/2015   CHOLHDL 2.5 07/09/2015   Lab Results  Component Value Date   NA 141 10/10/2015   K 3.7 10/10/2015   CREATININE 0.69 10/10/2015   GFRNONAA 97 10/10/2015   GFRAA 112 10/10/2015   GLUCOSE 223 (H) 10/10/2015     The ASCVD Risk score Mikey Bussing DC Jr., et al., 2013) failed to calculate for the following reasons:   Cannot find a previous HDL lab   Cannot find a previous total cholesterol lab    --------------------------------------------------------------------------------------------------- Anxiety, Follow-up  She was last seen for anxiety 1 years ago. Changes made at last visit include patient reports she stopped taking the Xanax. Notices she does not sleep as well without it. She has trouble sleeping due to her husband's failing health.    She reports poor compliance with treatment. She reports good to fair tolerance of treatment. When taking medication She is not having side effects.   She feels her anxiety is moderate and same as before since last visit. Patient reports that she is unable to sleep and since husband decline in help she has become more irritable, nervous and anxious.  Symptoms: No chest pain No difficulty concentrating  No dizziness Yes fatigue  No feelings of losing control No insomnia  Yes irritable No palpitations  No panic attacks No racing thoughts  No shortness of breath No sweating  No tremors/shakes    GAD-7 Results GAD-7 Generalized Anxiety Disorder Screening Tool 06/02/2020  1. Feeling Nervous, Anxious, or on Edge 3  2. Not Being Able to Stop or Control Worrying 0  3. Worrying Too Much About Different Things 0  4. Trouble Relaxing 3  5. Being So Restless it's Hard To Sit Still 2  6. Becoming Easily Annoyed or Irritable 3  7. Feeling Afraid As If Something Awful Might Happen 0  Total GAD-7 Score 11  Difficulty At Work, Home, or Getting  Along With Others? Somewhat difficult    PHQ-9 Scores PHQ9 SCORE ONLY 06/02/2020 05/04/2018 09/09/2016  PHQ-9 Total Score 5 0 0    --------------------------------------------------------------------------------------------------- Patient Active Problem List   Diagnosis Date Noted  . Swelling of limb 05/13/2020  . Pain in limb 05/13/2020  . Bilateral carpal tunnel syndrome 01/17/2018  . History of colonic polyps   . Benign neoplasm of transverse colon   . Benign neoplasm of ascending colon   .  Elbow pain, chronic 10/10/2015  . Myalgia 10/10/2015  . Diabetic neuropathy ([place]) 07/11/2015  . Dizziness 06/25/2015  . Bilateral leg edema 06/25/2015  . Middle insomnia 06/25/2015  . Absolute anemia 04/29/2015  . Anxiety 04/29/2015  . Colon polyp 04/29/2015  . DD (diverticular disease) 04/29/2015  . Backhand tennis elbow 04/29/2015  . Hemorrhoid 04/29/2015  . Indrawn nipple 04/29/2015  . 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 ANA positive 04/29/2015  . Avitaminosis D 04/29/2015  . Herpes zona 04/29/2015  . Diabetes mellitus without complication ([place])   . Hyperlipidemia   . Asthma, exogenous 02/27/2009  . Cervical pain 02/27/2009  . Apnea, sleep 12/16/2008  . Diabetes mellitus type 2, uncontrolled ([place]) 05/30/2006  . Hypercholesterolemia without hypertriglyceridemia 05/30/2006   Past Medical History:  Diagnosis Date  . Allergic rhinitis, seasonal   . Diabetes mellitus without complication ([place])   . Hyperlipidemia   . Hypertension   . Polyp of colon   . Sleep apnea        Medications: Outpatient Medications Prior to Visit  Medication Sig  . albuterol (PROVENTIL HFA;VENTOLIN HFA) 108 (90 Base) MCG/ACT inhaler Inhale 1-2 puffs into the lungs every 6 (six) hours as needed.  Marland Kitchen aspirin 81 MG tablet Take 81 mg by mouth daily.  . baclofen (LIORESAL) 10 MG tablet Take by mouth.  . fluticasone (FLONASE) 50 MCG/ACT nasal spray Place 2 sprays into both nostrils daily. (Patient taking differently: Place 2 sprays into both nostrils as needed. )  . furosemide (LASIX) 20 MG tablet TAKE 1 TABLET BY MOUTH DAILY  . HYDROcodone-acetaminophen (NORCO) 10-325 MG tablet Take 1 tablet by mouth every 6 (six) hours as needed.  . insulin aspart (NOVOLOG) 100 UNIT/ML injection Inject into the skin. 16-18-20 three times daily  . Insulin Glargine (TOUJEO SOLOSTAR) 300 UNIT/ML SOPN INJECT 70 UNITS EVERY NIGHT AS DIRECTED  . loratadine (CLARITIN) 10 MG tablet Take 1 tablet by mouth daily.  Reported on 01/02/2016  . metFORMIN (GLUCOPHAGE) 1000 MG tablet Take 1000 mg in am and 1500 mg at bedtime  . Multiple Vitamin (MULTI-VITAMINS) TABS Take by mouth.  Glory Rosebush VERIO test strip   . ULTICARE SHORT PEN NEEDLES 31G X 8 MM MISC USE AS DIRECTED FOUR TIMES DAILY  . Vitamin D, Ergocalciferol, (DRISDOL) 1.25 MG (50000 UNIT) CAPS capsule TAKE 1 CAPSULE BY MOUTH EACH WEEK  . ALPRAZolam (XANAX) 0.5 MG tablet TAKE 1/2 TO 1 TABLET BY MOUTH 3 TIMES DAILY AS NEEDED FOR ANXIETY (Patient not taking: Reported on 05/13/2020)  . hydrochlorothiazide (HYDRODIURIL) 12.5 MG tablet Take by mouth.  . IBU 800 MG tablet TAKE ONE TABLET EVERY EIGHT HOURS AS NEEDED (Patient not taking: Reported on 05/13/2020)  . lisinopril (PRINIVIL,ZESTRIL) 5 MG tablet  (Patient not taking: Reported on 06/02/2020)  . lovastatin (MEVACOR) 40 MG tablet TAKE ONE TABLET AT BEDTIME (Patient not taking: Reported on 05/13/2020)  . nortriptyline (PAMELOR) 10 MG capsule  (Patient not taking: Reported on 06/02/2020)  . omeprazole (PRILOSEC) 40 MG capsule Take 1 capsule (40  mg total) by mouth daily. (Patient not taking: Reported on 06/02/2020)  . valACYclovir (VALTREX) 1000 MG tablet Take 1 tablet (1,000 mg total) by mouth 3 (three) times daily. (Patient not taking: Reported on 05/13/2020)   No facility-administered medications prior to visit.    Review of Systems  Constitutional: Negative.   HENT: Negative.   Eyes: Negative.   Respiratory: Negative.   Cardiovascular: Negative.   Gastrointestinal: Negative.   Endocrine: Negative.   Genitourinary: Negative.   Musculoskeletal: Negative.   Skin: Negative.   Allergic/Immunologic: Negative.   Neurological: Negative.   Hematological: Negative.   Psychiatric/Behavioral: Negative.     Last CBC Lab Results  Component Value Date   WBC 6.3 10/10/2015   HGB 13.1 10/10/2015   HCT 39.6 10/10/2015   MCV 78 (L) 10/10/2015   MCH 25.9 (L) 10/10/2015   RDW 14.7 10/10/2015   PLT 215  61/60/7371   Last metabolic panel Lab Results  Component Value Date   GLUCOSE 223 (H) 10/10/2015   NA 141 10/10/2015   K 3.7 10/10/2015   CL 98 10/10/2015   CO2 28 10/10/2015   BUN 12 10/10/2015   CREATININE 0.69 10/10/2015   GFRNONAA 97 10/10/2015   GFRAA 112 10/10/2015   CALCIUM 9.9 10/10/2015   PROT 7.6 10/10/2015   ALBUMIN 4.7 10/10/2015   LABGLOB 2.9 10/10/2015   AGRATIO 1.6 10/10/2015   BILITOT 0.3 10/10/2015   ALKPHOS 58 10/10/2015   AST 19 10/10/2015   ALT 24 10/10/2015      Objective    BP 138/77 (BP Location: Left Arm, Patient Position: Sitting, Cuff Size: Normal)   Pulse 86   Temp (!) 96.6 F (35.9 C) (Temporal)   Wt 213 lb (96.6 kg)   BMI 38.96 kg/m  BP Readings from Last 3 Encounters:  06/02/20 138/77  05/13/20 119/68  01/01/19 125/78   Wt Readings from Last 3 Encounters:  06/02/20 213 lb (96.6 kg)  05/13/20 211 lb 12.8 oz (96.1 kg)  01/01/19 209 lb 3.2 oz (94.9 kg)      Physical Exam Vitals reviewed.  Constitutional:      General: She is not in acute distress.    Appearance: Normal appearance. She is well-developed. She is obese. She is not ill-appearing or diaphoretic.  HENT:     Head: Normocephalic and atraumatic.  Neck:     Thyroid: No thyromegaly.     Vascular: No carotid bruit or JVD.     Trachea: No tracheal deviation.  Cardiovascular:     Rate and Rhythm: Normal rate and regular rhythm.     Pulses: Normal pulses.     Heart sounds: Normal heart sounds. No murmur heard.  No friction rub. No gallop.   Pulmonary:     Effort: Pulmonary effort is normal. No respiratory distress.     Breath sounds: Normal breath sounds. No wheezing or rales.  Musculoskeletal:     Cervical back: Normal range of motion and neck supple.     Right lower leg: No edema.     Left lower leg: No edema.  Lymphadenopathy:     Cervical: No cervical adenopathy.  Neurological:     Mental Status: She is alert.  Psychiatric:        Mood and Affect: Mood normal.         Behavior: Behavior normal.        Thought Content: Thought content normal.        Judgment: Judgment normal.  Results for orders placed or performed in visit on 06/02/20  Hemoglobin A1c  Result Value Ref Range   Hemoglobin A1C 7.4     Assessment & Plan     1. Hypercholesterolemia without hypertriglyceridemia Patient stopped lovastatin. Will check labs as below and f/u pending results. - Lipid panel - CBC with Differential/Platelet - Comprehensive metabolic panel  2. Essential hypertension Stable. Continue Benazepril 10mg . Will check labs as below and f/u pending results. F/U in 4 weeks for BP recheck.  - Lipid panel - CBC with Differential/Platelet - Comprehensive metabolic panel - benazepril (LOTENSIN) 10 MG tablet; Take 1 tablet (10 mg total) by mouth daily.  Dispense: 90 tablet; Refill: 3  3. Uncontrolled type 2 diabetes mellitus with hyperglycemia (H[facility]) A1c was 7.4. Followed by Dr. Gabriel Carina, endocrinology. Continue medications per endocrine.  - Lipid panel - Comprehensive metabolic panel  4. Diabetic polyneuropathy associated with type 2 diabetes mellitus ([place]) See above medical treatment plan.  5. Iron deficiency anemia secondary to inadequate dietary iron intake Stable. Will check labs as below and f/u pending results. - CBC with Differential/Platelet  6. Class 2 severe obesity due to excess calories with serious comorbidity and body mass index (BMI) of 38.0 to 38.9 in adult [facility]) Counseled patient on healthy lifestyle modifications including dieting and exercise.  - TSH  7. Claudication of both lower extremities ([place]) Followed by Vascular Surgery.  8. GAD (generalized anxiety disorder) Stable. Diagnosis pulled for medication refill. Continue current medical treatment plan. - ALPRAZolam (XANAX) 0.5 MG tablet; TAKE 1/2 TO 1 TABLET BY MOUTH 3 TIMES DAILY AS NEEDED FOR ANXIETY  Dispense: 90 tablet; Refill: 1  9. Bilateral edema of lower extremity Stable.  Diagnosis pulled for medication refill. Continue current medical treatment plan. - furosemide (LASIX) 20 MG tablet; Take 1 tablet (20 mg total) by mouth daily.  Dispense: 90 tablet; Refill: 1  10. Vitamin D deficiency H/O this. Postmenopausal. Will check labs as below and f/u pending results. - Vitamin D (25 hydroxy)   No follow-ups on file.      Reynolds Bowl, PA-C, have reviewed all documentation for this visit. The documentation on 06/03/20 for the exam, diagnosis, procedures, and orders are all accurate and complete.   Rubye Beach  [facility] [phone] (phone) [phone] (fax)  [place]

## 2020-06-02 NOTE — Patient Instructions (Signed)
10 Relaxation Techniques That Zap Stress Fast By Jeannette Moninger   Listen  Relax. You deserve it, it's good for you, and it takes less time than you think. You don't need a spa weekend or a retreat. Each of these stress-relieving tips can get you from OMG to om in less than 15 minutes. 1. Meditate  A few minutes of practice per day can help ease anxiety. "Research suggests that daily meditation may alter the brain's neural pathways, making you more resilient to stress," says psychologist Robbie Maller Hartman, PhD, a [place] health and wellness coach. It's simple. Sit up straight with both feet on the floor. Close your eyes. Focus your attention on reciting -- out loud or silently -- a positive mantra such as "I feel at peace" or "I love myself." Place one hand on your belly to sync the mantra with your breaths. Let any distracting thoughts float by like clouds. 2. Breathe Deeply  Take a 5-minute break and focus on your breathing. Sit up straight, eyes closed, with a hand on your belly. Slowly inhale through your nose, feeling the breath start in your abdomen and work its way to the top of your head. Reverse the process as you exhale through your mouth.  "Deep breathing counters the effects of stress by slowing the heart rate and lowering blood pressure," psychologist Judith Tutin, PhD, says. She's a certified life coach in [place], [place] 3. Be Present  Slow down.  "Take 5 minutes and focus on only one behavior with awareness," Tutin says. Notice how the air feels on your face when you're walking and how your feet feel hitting the ground. Enjoy the texture and taste of each bite of food. When you spend time in the moment and focus on your senses, you should feel less tense. 4. Reach Out  Your social network is one of your best tools for handling stress. Talk to others -- preferably face to face, or at least on the phone. Share what's going on. You can get a fresh perspective while keeping your  connection strong. 5. Tune In to Your Body  Mentally scan your body to get a sense of how stress affects it each day. Lie on your back, or sit with your feet on the floor. Start at your toes and work your way up to your scalp, noticing how your body feels.  10 Relaxation Techniques That Zap Stress Fast By Jeannette Moninger   Listen  "Simply be aware of places you feel tight or loose without trying to change anything," Tutin says. For 1 to 2 minutes, imagine each deep breath flowing to that body part. Repeat this process as you move your focus up your body, paying close attention to sensations you feel in each body part. 6. Decompress  Place a warm heat wrap around your neck and shoulders for 10 minutes. Close your eyes and relax your face, neck, upper chest, and back muscles. Remove the wrap, and use a tennis ball or foam roller to massage away tension.  "Place the ball between your back and the wall. Lean into the ball, and hold gentle pressure for up to 15 seconds. Then move the ball to another spot, and apply pressure," says Cathy Benninger, a nurse practitioner and assistant professor at [facility] [employer] [facility] in [place]. 7. Laugh Out Loud  A good belly laugh doesn't just lighten the load mentally. It lowers cortisol, your body's stress hormone, and boosts brain chemicals called endorphins, which help   your mood. Lighten up by tuning in to your favorite sitcom or video, reading the comics, or chatting with someone who makes you smile. 8. Crank Up the Tunes  Research shows that listening to soothing music can lower blood pressure, heart rate, and anxiety. "Create a playlist of songs or nature sounds (the ocean, a bubbling brook, birds chirping), and allow your mind to focus on the different melodies, instruments, or singers in the piece," Benninger says. You also can blow off steam by rocking out to more upbeat tunes -- or singing at the top of your lungs! 9. Get Moving   You don't have to run in order to get a runner's high. All forms of exercise, including yoga and walking, can ease depression and anxiety by helping the brain release feel-good chemicals and by giving your body a chance to practice dealing with stress. You can go for a quick walk around the block, take the stairs up and down a few flights, or do some stretching exercises like head rolls and shoulder shrugs. 10. Be Grateful  Keep a gratitude journal or several (one by your bed, one in your purse, and one at work) to help you remember all the things that are good in your life.  "Being grateful for your blessings cancels out negative thoughts and worries," says Joni Emmerling, a wellness coach in [place], [place].  Use these journals to savor good experiences like a child's smile, a sunshine-filled day, and good health. Don't forget to celebrate accomplishments like mastering a new task at work or a new hobby. When you start feeling stressed, spend a few minutes looking through your notes to remind yourself what really matters.  

## 2020-06-03 DIAGNOSIS — I10 Essential (primary) hypertension: Secondary | ICD-10-CM | POA: Diagnosis not present

## 2020-06-03 DIAGNOSIS — D508 Other iron deficiency anemias: Secondary | ICD-10-CM | POA: Diagnosis not present

## 2020-06-03 DIAGNOSIS — E1165 Type 2 diabetes mellitus with hyperglycemia: Secondary | ICD-10-CM | POA: Diagnosis not present

## 2020-06-03 DIAGNOSIS — E559 Vitamin D deficiency, unspecified: Secondary | ICD-10-CM | POA: Diagnosis not present

## 2020-06-03 DIAGNOSIS — E7800 Pure hypercholesterolemia, unspecified: Secondary | ICD-10-CM | POA: Diagnosis not present

## 2020-06-04 LAB — COMPREHENSIVE METABOLIC PANEL
ALT: 39 IU/L — ABNORMAL HIGH (ref 0–32)
AST: 25 IU/L (ref 0–40)
Albumin/Globulin Ratio: 1.7 (ref 1.2–2.2)
Albumin: 4.4 g/dL (ref 3.8–4.8)
Alkaline Phosphatase: 57 IU/L (ref 48–121)
BUN/Creatinine Ratio: 19 (ref 12–28)
BUN: 15 mg/dL (ref 8–27)
Bilirubin Total: 0.3 mg/dL (ref 0.0–1.2)
CO2: 23 mmol/L (ref 20–29)
Calcium: 9.2 mg/dL (ref 8.7–10.3)
Chloride: 100 mmol/L (ref 96–106)
Creatinine, Ser: 0.8 mg/dL (ref 0.57–1.00)
GFR calc Af Amer: 91 mL/min/{1.73_m2} (ref >59)
GFR calc non Af Amer: 79 mL/min/{1.73_m2} (ref >59)
Globulin, Total: 2.6 g/dL (ref 1.5–4.5)
Glucose: 215 mg/dL — ABNORMAL HIGH (ref 65–99)
Potassium: 4.4 mmol/L (ref 3.5–5.2)
Sodium: 140 mmol/L (ref 134–144)
Total Protein: 7 g/dL (ref 6.0–8.5)

## 2020-06-04 LAB — CBC WITH DIFFERENTIAL/PLATELET
Basophils Absolute: 0.1 10*3/uL (ref 0.0–0.2)
Basos: 1 %
EOS (ABSOLUTE): 0.2 10*3/uL (ref 0.0–0.4)
Eos: 4 %
Hematocrit: 37.9 % (ref 34.0–46.6)
Hemoglobin: 11.8 g/dL (ref 11.1–15.9)
Immature Grans (Abs): 0 10*3/uL (ref 0.0–0.1)
Immature Granulocytes: 1 %
Lymphocytes Absolute: 2 10*3/uL (ref 0.7–3.1)
Lymphs: 32 %
MCH: 25.1 pg — ABNORMAL LOW (ref 26.6–33.0)
MCHC: 31.1 g/dL — ABNORMAL LOW (ref 31.5–35.7)
MCV: 81 fL (ref 79–97)
Monocytes Absolute: 0.6 10*3/uL (ref 0.1–0.9)
Monocytes: 11 %
Neutrophils Absolute: 3.1 10*3/uL (ref 1.4–7.0)
Neutrophils: 51 %
Platelets: 203 10*3/uL (ref 150–450)
RBC: 4.7 x10E6/uL (ref 3.77–5.28)
RDW: 13.5 % (ref 11.7–15.4)
WBC: 6.1 10*3/uL (ref 3.4–10.8)

## 2020-06-04 LAB — LIPID PANEL
Chol/HDL Ratio: 4 ratio (ref 0.0–4.4)
Cholesterol, Total: 200 mg/dL — ABNORMAL HIGH (ref 100–199)
HDL: 50 mg/dL (ref >39)
LDL Chol Calc (NIH): 116 mg/dL — ABNORMAL HIGH (ref 0–99)
Triglycerides: 196 mg/dL — ABNORMAL HIGH (ref 0–149)
VLDL Cholesterol Cal: 34 mg/dL (ref 5–40)

## 2020-06-04 LAB — TSH: TSH: 2.91 u[IU]/mL (ref 0.450–4.500)

## 2020-06-04 LAB — VITAMIN D 25 HYDROXY (VIT D DEFICIENCY, FRACTURES): Vit D, 25-Hydroxy: 38.7 ng/mL (ref 30.0–100.0)

## 2020-06-05 ENCOUNTER — Telehealth: Payer: Self-pay

## 2020-06-05 DIAGNOSIS — E7800 Pure hypercholesterolemia, unspecified: Secondary | ICD-10-CM

## 2020-06-05 MED ORDER — LOVASTATIN 40 MG PO TABS: 40.0000 mg | ORAL_TABLET | Freq: Every day | ORAL | 3 refills | Status: DC

## 2020-06-05 NOTE — Telephone Encounter (Signed)
-----   Message from Mar Daring, [place] sent at 06/04/2020  9:08 AM EDT ----- Cholesterol is up compared to previous labs. I would recommend to restart a cholesterol lowering medication like your lovastatin, or we can change if desired. Blood count is normal. Kidney and liver enzymes are normal. Sodium, potassium and calcium are normal. Vit D is normal.

## 2020-06-05 NOTE — Telephone Encounter (Signed)
[facility] 

## 2020-06-05 NOTE — Telephone Encounter (Signed)
Patient advised as directed below. Reports that she will stay with the Lovastatin and she need a new script. Pharmacy: [facility]

## 2020-06-06 ENCOUNTER — Ambulatory Visit (INDEPENDENT_AMBULATORY_CARE_PROVIDER_SITE_OTHER): Payer: BC Managed Care – PPO

## 2020-06-06 ENCOUNTER — Other Ambulatory Visit: Payer: Self-pay

## 2020-06-06 ENCOUNTER — Encounter (INDEPENDENT_AMBULATORY_CARE_PROVIDER_SITE_OTHER): Payer: Self-pay | Admitting: Nurse Practitioner

## 2020-06-06 ENCOUNTER — Ambulatory Visit (INDEPENDENT_AMBULATORY_CARE_PROVIDER_SITE_OTHER): Payer: BC Managed Care – PPO | Admitting: Nurse Practitioner

## 2020-06-06 VITALS — BP 143/74 | HR 92 | Resp 16 | Wt 212.8 lb

## 2020-06-06 DIAGNOSIS — M79605 Pain in left leg: Secondary | ICD-10-CM | POA: Diagnosis not present

## 2020-06-06 DIAGNOSIS — M7989 Other specified soft tissue disorders: Secondary | ICD-10-CM

## 2020-06-06 DIAGNOSIS — M79604 Pain in right leg: Secondary | ICD-10-CM | POA: Diagnosis not present

## 2020-06-06 DIAGNOSIS — I10 Essential (primary) hypertension: Secondary | ICD-10-CM | POA: Diagnosis not present

## 2020-06-06 DIAGNOSIS — R600 Localized edema: Secondary | ICD-10-CM

## 2020-06-06 DIAGNOSIS — I739 Peripheral vascular disease, unspecified: Secondary | ICD-10-CM | POA: Diagnosis not present

## 2020-06-09 ENCOUNTER — Encounter (INDEPENDENT_AMBULATORY_CARE_PROVIDER_SITE_OTHER): Payer: Self-pay | Admitting: Nurse Practitioner

## 2020-06-09 NOTE — Progress Notes (Signed)
Subjective:    Patient ID: Jessica Elliott, female    DOB: Mar 08, 1956, 63 y.o.   MRN: [id number] Chief Complaint  Patient presents with  . Follow-up    ultrasound follow up    Jessica Elliott is a 65 y.o. female.  Patient presents today for evaluation of lower extremity leg pain.  This is been ongoing for about 6 to 8 months.  She notes that it is unclear comfortable it happens mostly at night.  She notices that the pain is achy and uncomfortable.  The patient has not tried any medications for pain relief as she has been aversion to taking pain medication.  The patient also does note some leg swelling which happens off and on.  She notes no clear cause or inciting event.  Nothing clearly makes it better with rest elevation, rest or exercise.  Patient has no wounds or ulcerations.  No previous history of DVT or superficial thrombophlebitis to her knowledge.  Today noninvasive studies show a right ABI 1.15 with a left of 1.18.  The patient has strong triphasic tibial artery waveforms with good toe waveforms bilaterally.  The patient also underwent bilateral lower extremity venous reflux study which reveals no evidence of DVT or superficial venous thrombosis bilaterally.  No evidence of deep venous insufficiency seen bilaterally.  No evidence of superficial venous reflux seen in the great or short saphenous veins bilaterally.  There does appear to be a Baker's cysts behind the bilateral knees.   Review of Systems  Cardiovascular: Positive for leg swelling.  Musculoskeletal: Positive for arthralgias and joint swelling.  All other systems reviewed and are negative.      Objective:   Physical Exam Vitals reviewed.  HENT:     Head: Normocephalic.  Cardiovascular:     Rate and Rhythm: Normal rate and regular rhythm.     Pulses: Normal pulses.  Pulmonary:     Effort: Pulmonary effort is normal.     Breath sounds: Normal breath sounds.  Musculoskeletal:        General: Tenderness  present.  Skin:    Capillary Refill: Capillary refill takes less than 2 seconds.  Neurological:     Mental Status: She is alert and oriented to person, place, and time.  Psychiatric:        Mood and Affect: Mood normal.        Behavior: Behavior normal.        Thought Content: Thought content normal.        Judgment: Judgment normal.     BP (!) 143/74 (BP Location: Right Arm)   Pulse 92   Resp 16   Wt 212 lb 12.8 oz (96.5 kg)   BMI 38.92 kg/m   Past Medical History:  Diagnosis Date  . Allergic rhinitis, seasonal   . Diabetes mellitus without complication ([place])   . Hyperlipidemia   . Hypertension   . Polyp of colon   . Sleep apnea     Social History   Socioeconomic History  . Marital status: Married    Spouse name: Not on file  . Number of children: 1  . Years of education: Not on file  . 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 Smoking status: Former Research scientist (life sciences)  . Smokeless tobacco: Never Used  Vaping Use  . Vaping Use: Never used  Substance and Sexual Activity  . Alcohol use: Yes  Alcohol/week: 0.0 standard drinks    Comment: occasionally  . Drug use: No  . Sexual activity: Not on file  Other Topics Concern  . Not on file  Social History Narrative  . Not on file   Social Determinants of Health   Financial Resource Strain:   . Difficulty of Paying Living Expenses:   Food Insecurity:   . Worried About Charity fundraiser in the Last Year:   . Arboriculturist in the Last Year:   Transportation Needs:   . Film/video editor (Medical):   Marland Kitchen Lack of Transportation (Non-Medical):   Physical Activity:   . Days of Exercise per Week:   . Minutes of Exercise per Session:   Stress:   . Feeling of Stress :   Social Connections:   . Frequency of Communication with Friends and Family:   . Frequency of Social Gatherings with Friends and Family:   . Attends Religious  Services:   . Active Member of Clubs or Organizations:   . Attends Archivist Meetings:   Marland Kitchen Marital Status:   Intimate Partner Violence:   . Fear of Current or Ex-Partner:   . Emotionally Abused:   Marland Kitchen Physically Abused:   . Sexually Abused:     Past Surgical History:  Procedure Laterality Date  . ABDOMINAL HYSTERECTOMY  1995   still have 1 ovary  . bone spur     removed from right shoulder  . BREAST BIOPSY Right 12/05/2019   stereo bx/ x clip/ path pending  . COLONOSCOPY WITH PROPOFOL N/A 11/22/2017   Procedure: COLONOSCOPY WITH PROPOFOL;  Surgeon: Lucilla Lame, MD;  Location[facility]COPY;  Service: Endoscopy;  Laterality: N/A;  . SHOULDER ARTHROSCOPY    . SHOULDER SURGERY Right 2007    Family History  Problem Relation Age of Onset  . Emphysema Mother   . Hyperlipidemia Mother   . Diabetes Father   . Aneurysm Father   . Cancer Brother   . Hyperlipidemia Brother   . Pneumonia Brother   . Diabetes Sister   . Hypertension Sister   . Diverticulitis Sister   . Stroke Sister   . Diabetes Sister   . Hypertension Sister   . Heart attack Sister   . Diabetes Sister   . Hypertension Sister   . Cancer Sister   . Heart disease Sister   . Emphysema Sister   . Diabetes Brother   . Hyperlipidemia Brother   . Hypertension Brother   . Crohn's disease Brother   . Breast cancer Maternal Aunt   . Breast cancer Other     Allergies  Allergen Reactions  . Ibuprofen Other (See Comments)    Drowsy  . Lisinopril Other (See Comments)    hematuria  . Statins Other (See Comments)  . Codeine Hives  . Penicillins Hives       Assessment & Plan:   1. Claudication of both lower extremities (HCC) Recommend:  I do not find evidence of Vascular pathology that would explain the patient's symptoms  The patient has atypical pain symptoms for vascular disease  I do not find evidence of Vascular pathology that would explain the patient's symptoms and I suspect the patient  is c/o pseudoclaudication.  Patient should have an evaluation of his LS spine which I defer to the primary service.  Noninvasive studies including venous ultrasound of the legs do not identify vascular problems  The patient should continue walking and begin a more formal exercise program.  The patient should continue his antiplatelet therapy and aggressive treatment of the lipid abnormalities. The patient should begin wearing graduated compression socks 15-20 mmHg strength to control her mild edema.  Patient will follow-up with me on a PRN basis  Further work-up of her lower extremity pain is deferred to the primary service     2. Bilateral leg edema No surgery or intervention at this point in time.  I have reviewed my discussion with the patient regarding venous insufficiency and why it causes symptoms. I have discussed with the patient the chronic skin changes that accompany venous insufficiency and the long term sequela such as ulceration. Patient will contnue wearing graduated compression stockings on a daily basis, as this has provided excellent control of his edema. The patient will put the stockings on first thing in the morning and removing them in the evening. The patient is reminded not to sleep in the stockings.  In addition, behavioral modification including elevation during the day will be initiated. Exercise is strongly encouraged.  Given the patient's good control and lack of any problems a lymph pump in not need at this time.  The patient will follow up with me PRN should anything change.  The patient voices agreement with this plan.   3. Essential hypertension Continue antihypertensive medications as already ordered, these medications have been reviewed and there are no changes at this time.    Current Outpatient Medications on File Prior to Visit  Medication Sig Dispense Refill  . albuterol (PROVENTIL HFA;VENTOLIN HFA) 108 (90 Base) MCG/ACT inhaler Inhale 1-2 puffs  into the lungs every 6 (six) hours as needed. 1 Inhaler 1  . ALPRAZolam (XANAX) 0.5 MG tablet TAKE 1/2 TO 1 TABLET BY MOUTH 3 TIMES DAILY AS NEEDED FOR ANXIETY 90 tablet 1  . aspirin 81 MG tablet Take 81 mg by mouth daily.    . benazepril (LOTENSIN) 10 MG tablet Take 1 tablet (10 mg total) by mouth daily. 90 tablet 3  . fluticasone (FLONASE) 50 MCG/ACT nasal spray Place 2 sprays into both nostrils daily. (Patient taking differently: Place 2 sprays into both nostrils as needed. ) 16 g 6  . furosemide (LASIX) 20 MG tablet Take 1 tablet (20 mg total) by mouth daily. 90 tablet 1  . insulin aspart (NOVOLOG) 100 UNIT/ML injection Inject into the skin. 16-18-20 three times daily    . Insulin Glargine (TOUJEO SOLOSTAR) 300 UNIT/ML SOPN INJECT 70 UNITS EVERY NIGHT AS DIRECTED    . loratadine (CLARITIN) 10 MG tablet Take 1 tablet by mouth daily as needed. Reported on 01/02/2016    . lovastatin (MEVACOR) 40 MG tablet Take 1 tablet (40 mg total) by mouth at bedtime. 90 tablet 3  . metFORMIN (GLUCOPHAGE) 1000 MG tablet Take 1000 mg in am and 1500 mg at bedtime    . Multiple Vitamin (MULTI-VITAMINS) TABS Take by mouth.    Glory Rosebush VERIO test strip   10  . ULTICARE SHORT PEN NEEDLES 31G X 8 MM MISC USE AS DIRECTED FOUR TIMES DAILY 200 each 5  . Vitamin D, Ergocalciferol, (DRISDOL) 1.25 MG (50000 UNIT) CAPS capsule TAKE 1 CAPSULE BY MOUTH EACH WEEK 4 capsule 5  . baclofen (LIORESAL) 10 MG tablet Take by mouth. (Patient not taking: Reported on 06/06/2020)     No current facility-administered medications on file prior to visit.    There are no Patient Instructions on file for this visit. No follow-ups on file.   Kris Hartmann, NP

## 2020-07-18 DIAGNOSIS — S90812A Abrasion, left foot, initial encounter: Secondary | ICD-10-CM | POA: Diagnosis not present

## 2020-07-18 DIAGNOSIS — E1165 Type 2 diabetes mellitus with hyperglycemia: Secondary | ICD-10-CM | POA: Diagnosis not present

## 2020-07-18 DIAGNOSIS — Z794 Long term (current) use of insulin: Secondary | ICD-10-CM | POA: Diagnosis not present

## 2020-08-19 ENCOUNTER — Other Ambulatory Visit: Payer: Self-pay | Admitting: Physician Assistant

## 2020-08-19 NOTE — Telephone Encounter (Signed)
Requested medication (s) are desue for refill today: Yes  Requested medication (s) are on the active medication list: Yes  Last refill:  02/01/20  Future visit scheduled: Yes  Notes to clinic: Unable to refill per protocol, cannot delegate     Requested Prescriptions  Pending Prescriptions Disp Refills   Vitamin D, Ergocalciferol, (DRISDOL) 1.25 MG (50000 UNIT) CAPS capsule [Pharmacy Med Name: VITAMIN D (ERGOCALCIFEROL) 1.25 MG] 4 capsule 5    Sig: TAKE 1 CAPSULE BY MOUTH [place]      Endocrinology:  Vitamins - Vitamin D Supplementation Failed - 08/19/2020  7:19 PM      Failed - 50,000 IU strengths are not delegated      Failed - Phosphate in normal range and within 360 days    No results found for: PHOS        Passed - Ca in normal range and within 360 days    Calcium  Date Value Ref Range Status  06/03/2020 9.2 8.7 - 10.3 mg/dL Final          Passed - Vitamin D in normal range and within 360 days    Vit D, 25-Hydroxy  Date Value Ref Range Status  06/03/2020 38.7 30.0 - 100.0 ng/mL Final    Comment:    Vitamin D deficiency has been defined by the Institute of Medicine and an Endocrine Society practice guideline as a level of serum 25-OH vitamin D less than 20 ng/mL (1,2). The Endocrine Society went on to further define vitamin D insufficiency as a level between 21 and 29 ng/mL (2). 1. IOM (Institute of Medicine). 2010. Dietary reference    intakes for calcium and D. [place]: The    [employer]. 2. Holick MF, Binkley [place], Bischoff-Ferrari HA, et al.    Evaluation, treatment, and prevention of vitamin D    deficiency: an Endocrine Society clinical practice    guideline. JCEM. 2011 Jul; 96(7):1911-30.           Passed - Valid encounter within last 12 months    Recent Outpatient Visits           2 months ago Hypercholesterolemia without hypertriglyceridemia   [place], Clearnce Sorrel, [place]   1 year ago [place], Dionne Bucy, MD   2 years ago Disseminated herpes zoster   [place], Clearnce Sorrel, [place]   3 years ago Obstructive sleep apnea syndrome   [facility] [place], Clearnce Sorrel, [place]   3 years ago Annual physical exam   [place], Clearnce Sorrel, [place]       Future Appointments             In 3 months [place], Clearnce Sorrel, PA-C [employer], [place]

## 2020-08-20 NOTE — Telephone Encounter (Signed)
Vit D in June was normal but still low.  Do you want her to continue high dose or OTC

## 2020-09-15 DIAGNOSIS — N182 Chronic kidney disease, stage 2 (mild): Secondary | ICD-10-CM | POA: Diagnosis not present

## 2020-09-15 DIAGNOSIS — E1169 Type 2 diabetes mellitus with other specified complication: Secondary | ICD-10-CM | POA: Diagnosis not present

## 2020-09-15 DIAGNOSIS — Z794 Long term (current) use of insulin: Secondary | ICD-10-CM | POA: Diagnosis not present

## 2020-09-15 DIAGNOSIS — E1122 Type 2 diabetes mellitus with diabetic chronic kidney disease: Secondary | ICD-10-CM | POA: Diagnosis not present

## 2020-09-15 DIAGNOSIS — E669 Obesity, unspecified: Secondary | ICD-10-CM | POA: Diagnosis not present

## 2020-09-15 LAB — HEMOGLOBIN A1C: Hemoglobin A1C: 8.1

## 2020-09-16 NOTE — Progress Notes (Signed)
Established patient visit   Patient: Jessica Elliott   DOB: Mar 22, 1956   64 y.o. Female  MRN: [id number] Visit Date: 09/17/2020  Today's healthcare provider: Mar Daring, PA-C   Chief Complaint  Patient presents with  . Medication Problem   Subjective    HPI  Patient here with c/o having side effect from the Lovastatin. She is having pain from her hip down to her legs and feels by the end of the day she can hardly walk and she was not having this before.  Patient Active Problem List   Diagnosis Date Noted  . Claudication of both lower extremities ([place]) 06/02/2020  . Hypertension   . Swelling of limb 05/13/2020  . Pain in limb 05/13/2020  . Bilateral carpal tunnel syndrome 01/17/2018  . History of colonic polyps   . Benign neoplasm of transverse colon   . Benign neoplasm of ascending colon   . Elbow pain, chronic 10/10/2015  . Myalgia 10/10/2015  . Diabetic neuropathy ([place]) 07/11/2015  . Dizziness 06/25/2015  . Bilateral leg edema 06/25/2015  . Middle insomnia 06/25/2015  . Absolute anemia 04/29/2015  . Anxiety 04/29/2015  . Colon polyp 04/29/2015  . DD (diverticular disease) 04/29/2015  . Backhand tennis elbow 04/29/2015  . Hemorrhoid 04/29/2015  . Indrawn nipple 04/29/2015  . 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 ANA positive 04/29/2015  . Avitaminosis D 04/29/2015  . Herpes zona 04/29/2015  . Diabetes mellitus without complication ([place])   . Asthma, exogenous 02/27/2009  . Cervical pain 02/27/2009  . Apnea, sleep 12/16/2008  . Diabetes mellitus type 2, uncontrolled ([place]) 05/30/2006  . Hypercholesterolemia without hypertriglyceridemia 05/30/2006   Past Medical History:  Diagnosis Date  . Allergic rhinitis, seasonal   . Diabetes mellitus without complication ([place])   . Hyperlipidemia   . Hypertension   . Polyp of colon   . Sleep apnea        Medications: Outpatient Medications Prior to Visit  Medication Sig  . albuterol (PROVENTIL  HFA;VENTOLIN HFA) 108 (90 Base) MCG/ACT inhaler Inhale 1-2 puffs into the lungs every 6 (six) hours as needed.  . ALPRAZolam (XANAX) 0.5 MG tablet TAKE 1/2 TO 1 TABLET BY MOUTH 3 TIMES DAILY AS NEEDED FOR ANXIETY  . aspirin 81 MG tablet Take 81 mg by mouth daily.  . benazepril (LOTENSIN) 10 MG tablet Take 1 tablet (10 mg total) by mouth daily.  . fluticasone (FLONASE) 50 MCG/ACT nasal spray Place 2 sprays into both nostrils daily. (Patient taking differently: Place 2 sprays into both nostrils as needed. )  . furosemide (LASIX) 20 MG tablet Take 1 tablet (20 mg total) by mouth daily.  . insulin aspart (NOVOLOG) 100 UNIT/ML injection Inject into the skin. 16-18-20 three times daily  . Insulin Glargine (TOUJEO SOLOSTAR) 300 UNIT/ML SOPN INJECT 70 UNITS EVERY NIGHT AS DIRECTED  . loratadine (CLARITIN) 10 MG tablet Take 1 tablet by mouth daily as needed. Reported on 01/02/2016  . lovastatin (MEVACOR) 40 MG tablet Take 1 tablet (40 mg total) by mouth at bedtime.  . metFORMIN (GLUCOPHAGE) 1000 MG tablet Take 1000 mg in am and 1500 mg at bedtime  . Multiple Vitamin (MULTI-VITAMINS) TABS Take by mouth.  Marland Kitchen ULTICARE SHORT PEN NEEDLES 31G X 8 MM MISC USE AS DIRECTED FOUR TIMES DAILY  . Vitamin D, Ergocalciferol, (DRISDOL) 1.25 MG (50000 UNIT) CAPS capsule TAKE 1 CAPSULE BY MOUTH EACH WEEK  . baclofen (LIORESAL) 10 MG tablet Take by mouth. (Patient not taking: Reported  on 06/06/2020)  . ONETOUCH VERIO test strip    No facility-administered medications prior to visit.    Review of Systems  Constitutional: Negative.   Respiratory: Negative.   Cardiovascular: Negative.   Musculoskeletal: Positive for arthralgias and myalgias.    Last CBC Lab Results  Component Value Date   WBC 6.1 06/03/2020   HGB 11.8 06/03/2020   HCT 37.9 06/03/2020   MCV 81 06/03/2020   MCH 25.1 (L) 06/03/2020   RDW 13.5 06/03/2020   PLT 203 12/75/1700   Last metabolic panel Lab Results  Component Value Date   GLUCOSE 215  (H) 06/03/2020   NA 140 06/03/2020   K 4.4 06/03/2020   CL 100 06/03/2020   CO2 23 06/03/2020   BUN 15 06/03/2020   CREATININE 0.80 06/03/2020   GFRNONAA 79 06/03/2020   GFRAA 91 06/03/2020   CALCIUM 9.2 06/03/2020   PROT 7.0 06/03/2020   ALBUMIN 4.4 06/03/2020   LABGLOB 2.6 06/03/2020   AGRATIO 1.7 06/03/2020   BILITOT 0.3 06/03/2020   ALKPHOS 57 06/03/2020   AST 25 06/03/2020   ALT 39 (H) 06/03/2020      Objective    BP 124/79 (BP Location: Left Arm, Patient Position: Sitting, Cuff Size: Large)   Pulse 91   Temp 98.1 F (36.7 C) (Oral)   Resp 16   Wt 216 lb 3.2 oz (98.1 kg)   BMI 39.54 kg/m  BP Readings from Last 3 Encounters:  09/17/20 124/79  06/06/20 (!) 143/74  06/02/20 138/77   Wt Readings from Last 3 Encounters:  09/17/20 216 lb 3.2 oz (98.1 kg)  06/06/20 212 lb 12.8 oz (96.5 kg)  06/02/20 213 lb (96.6 kg)      Physical Exam Vitals reviewed.  Constitutional:      General: She is not in acute distress.    Appearance: Normal appearance. She is well-developed. She is not diaphoretic.  Cardiovascular:     Rate and Rhythm: Normal rate and regular rhythm.     Pulses: Normal pulses.     Heart sounds: Normal heart sounds. No murmur heard.  No friction rub. No gallop.   Pulmonary:     Effort: Pulmonary effort is normal. No respiratory distress.     Breath sounds: Normal breath sounds. No wheezing or rales.  Musculoskeletal:     Cervical back: Normal range of motion and neck supple.  Neurological:     Mental Status: She is alert.  Psychiatric:        Mood and Affect: Mood normal.        Thought Content: Thought content normal.      No results found for any visits on 09/17/20.  Assessment & Plan     1. Dyslipidemia with low high density lipoprotein (HDL) cholesterol with hypertriglyceridemia due to type 2 diabetes mellitus ([place]) Has had myalgias with statins. Will try Zetai as below. If myalgias recur, please call the office.  - ezetimibe (ZETIA) 10  MG tablet; Take 1 tablet (10 mg total) by mouth daily.  Dispense: 90 tablet; Refill: 1  2. RUQ pain Has underwent US and has no gallstones or structural issues with gallbladder noted. However, patient continues to have symptoms of pain associated with eating. Will get HIDA scan as below. Will f/u pending results and refer to gen surg if needed. - NM Hepato W/Eject Fract; Future  3. Need for influenza vaccination Flu vaccine given today without complication. Patient sat upright for 15 minutes to check for adverse reaction before  being released. - Flu Vaccine QUAD 36+ mos IM  No follow-ups on file.         Rubye Beach  [facility] [phone] (phone) [phone] (fax)  [place]

## 2020-09-17 ENCOUNTER — Ambulatory Visit: Payer: BC Managed Care – PPO | Admitting: Physician Assistant

## 2020-09-17 ENCOUNTER — Other Ambulatory Visit: Payer: Self-pay

## 2020-09-17 ENCOUNTER — Encounter: Payer: Self-pay | Admitting: Physician Assistant

## 2020-09-17 VITALS — BP 124/79 | HR 91 | Temp 98.1°F | Resp 16 | Wt 216.2 lb

## 2020-09-17 DIAGNOSIS — E782 Mixed hyperlipidemia: Secondary | ICD-10-CM

## 2020-09-17 DIAGNOSIS — E113293 Type 2 diabetes mellitus with mild nonproliferative diabetic retinopathy without macular edema, bilateral: Secondary | ICD-10-CM | POA: Diagnosis not present

## 2020-09-17 DIAGNOSIS — Z789 Other specified health status: Secondary | ICD-10-CM | POA: Diagnosis not present

## 2020-09-17 DIAGNOSIS — E1169 Type 2 diabetes mellitus with other specified complication: Secondary | ICD-10-CM

## 2020-09-17 DIAGNOSIS — Z23 Encounter for immunization: Secondary | ICD-10-CM | POA: Diagnosis not present

## 2020-09-17 DIAGNOSIS — E1165 Type 2 diabetes mellitus with hyperglycemia: Secondary | ICD-10-CM | POA: Diagnosis not present

## 2020-09-17 DIAGNOSIS — R1011 Right upper quadrant pain: Secondary | ICD-10-CM | POA: Diagnosis not present

## 2020-09-17 MED ORDER — EZETIMIBE 10 MG PO TABS: 10.0000 mg | ORAL_TABLET | Freq: Every day | ORAL | 1 refills | Status: DC

## 2020-09-23 ENCOUNTER — Encounter: Payer: Self-pay | Admitting: Physician Assistant

## 2020-10-01 ENCOUNTER — Other Ambulatory Visit: Payer: Self-pay

## 2020-10-01 ENCOUNTER — Encounter
Admission: RE | Admit: 2020-10-01 | Discharge: 2020-10-01 | Disposition: A | Discharge status: Home / Self Care | Payer: BC Managed Care – PPO | Source: Ambulatory Visit | Attending: Physician Assistant | Admitting: Physician Assistant

## 2020-10-01 DIAGNOSIS — R1011 Right upper quadrant pain: Secondary | ICD-10-CM | POA: Insufficient documentation

## 2020-10-01 MED ORDER — TECHNETIUM TC 99M MEBROFENIN IV KIT
5.0000 | PACK | Freq: Once | INTRAVENOUS | Status: AC | PRN
  Administered 2020-10-01: 4.632 via INTRAVENOUS

## 2020-10-03 ENCOUNTER — Telehealth: Payer: Self-pay | Admitting: Physician Assistant

## 2020-10-03 DIAGNOSIS — K219 Gastro-esophageal reflux disease without esophagitis: Secondary | ICD-10-CM

## 2020-10-03 MED ORDER — PANTOPRAZOLE SODIUM 40 MG PO TBEC: 40.0000 mg | DELAYED_RELEASE_TABLET | Freq: Every day | ORAL | 3 refills | Status: DC

## 2020-10-03 NOTE — Telephone Encounter (Signed)
Will send in Protonix for reflux. Call if pain does not improve with protonix over next 2-3 weeks. If not improving I will refer to GI.

## 2020-10-03 NOTE — Telephone Encounter (Signed)
Patient advised as directed below. 

## 2020-10-03 NOTE — Telephone Encounter (Signed)
-----   Message from Lazarus Gowda, [place] sent at 10/03/2020 11:35 AM EDT ----- Advised pressure pain is still the same no changes. Patient would like to know next steps. Reports some reflux also and willing to take med for that if you can send in to [facility]. Advised this is OTC also but she requested RX.

## 2020-10-06 ENCOUNTER — Telehealth: Payer: Self-pay | Admitting: Physician Assistant

## 2020-10-06 DIAGNOSIS — K219 Gastro-esophageal reflux disease without esophagitis: Secondary | ICD-10-CM

## 2020-10-06 MED ORDER — PANTOPRAZOLE SODIUM 40 MG PO TBEC: 40.0000 mg | DELAYED_RELEASE_TABLET | Freq: Every day | ORAL | 3 refills | Status: DC

## 2020-10-06 NOTE — Telephone Encounter (Signed)
-----   Message from Doristine Devoid, [place] sent at 10/06/2020  1:42 PM EDT -----  ----- Message ----- From: Mar Daring, PA-C Sent: 10/02/2020   3:15 PM EDT To: Marlyn Corporal Nurse  HIDA scan is completely normal. No gallbladder dysfunction.

## 2020-10-06 NOTE — Telephone Encounter (Signed)
Sorry I have sent pantoprazole, not omeprazole. Try for 2 weeks and call if pain is still present.

## 2020-11-10 ENCOUNTER — Other Ambulatory Visit: Payer: Self-pay | Admitting: Physician Assistant

## 2020-11-10 DIAGNOSIS — Z1231 Encounter for screening mammogram for malignant neoplasm of breast: Secondary | ICD-10-CM

## 2020-11-26 ENCOUNTER — Ambulatory Visit
Admission: RE | Admit: 2020-11-26 | Discharge: 2020-11-26 | Disposition: A | Discharge status: Home / Self Care | Payer: BC Managed Care – PPO | Source: Ambulatory Visit | Attending: Physician Assistant | Admitting: Physician Assistant

## 2020-11-26 ENCOUNTER — Other Ambulatory Visit: Payer: Self-pay

## 2020-11-26 DIAGNOSIS — Z1231 Encounter for screening mammogram for malignant neoplasm of breast: Secondary | ICD-10-CM | POA: Insufficient documentation

## 2020-12-01 ENCOUNTER — Telehealth: Payer: Self-pay

## 2020-12-01 NOTE — Telephone Encounter (Signed)
-----   Message from Mar Daring, PA-C sent at 12/01/2020  8:26 AM EST ----- Normal mammogram. Repeat screening in one year.

## 2020-12-01 NOTE — Telephone Encounter (Signed)
Written by Mar Daring, PA-C on 12/01/2020 8:26 AM EST Seen by patient Jessica Elliott on 12/01/2020 8:46 AM

## 2020-12-02 NOTE — Progress Notes (Signed)
Established patient visit   Patient: Jessica Elliott   DOB: [birth year]   64 y.o. Female  MRN: [id number] Visit Date: 12/03/2020  Today's healthcare provider: Mar Daring, PA-C   Chief Complaint  Patient presents with  . Follow-up   Subjective    HPI  Follow up for anxiety  The patient was last seen for this 6 months ago. Changes made at last visit include Continue current medical treatment plan. Need refill on Xanax  She reports excellent compliance with treatment. She feels that condition is Improved. She is not having side effects.   ----------------------------------------------------------------------------------------- Hypertension, follow-up  BP Readings from Last 3 Encounters:  12/03/20 (!) 142/58  09/17/20 124/79  06/06/20 (!) 143/74   Wt Readings from Last 3 Encounters:  12/03/20 213 lb 14.4 oz (97 kg)  09/17/20 216 lb 3.2 oz (98.1 kg)  06/06/20 212 lb 12.8 oz (96.5 kg)     She was last seen for hypertension 6 months ago.  BP at that visit was 138/77. Management since that visit includes Continue Benazepril 10mg    She reports excellent compliance with treatment. She is not having side effects.  She is following a Regular diet. She is not exercising.  Outside blood pressures are not being checked. Symptoms: No chest pain No chest pressure  No palpitations No syncope  No dyspnea No orthopnea  No paroxysmal nocturnal dyspnea No lower extremity edema   Pertinent labs: Lab Results  Component Value Date   CHOL 200 (H) 06/03/2020   HDL 50 06/03/2020   LDLCALC 116 (H) 06/03/2020   TRIG 196 (H) 06/03/2020   CHOLHDL 4.0 06/03/2020   Lab Results  Component Value Date   NA 140 06/03/2020   K 4.4 06/03/2020   CREATININE 0.80 06/03/2020   GFRNONAA 79 06/03/2020   GFRAA 91 06/03/2020   GLUCOSE 215 (H) 06/03/2020     The 10-year ASCVD risk score Mikey Bussing DC Jr., et al., 2013) is: 14.5%    --------------------------------------------------------------------------------------------------- She reports bad odor in her urine for the past month. No other symptoms. Does report smell is even without urinating. Notices even when she sits on the commode.    Patient Active Problem List   Diagnosis Date Noted  . Claudication of both lower extremities ([place]) 06/02/2020  . Hypertension   . Swelling of limb 05/13/2020  . Pain in limb 05/13/2020  . Bilateral carpal tunnel syndrome 01/17/2018  . History of colonic polyps   . Benign neoplasm of transverse colon   . Benign neoplasm of ascending colon   . Elbow pain, chronic 10/10/2015  . Myalgia 10/10/2015  . Diabetic neuropathy ([place]) 07/11/2015  . Dizziness 06/25/2015  . Bilateral leg edema 06/25/2015  . Middle insomnia 06/25/2015  . Absolute anemia 04/29/2015  . Anxiety 04/29/2015  . Colon polyp 04/29/2015  . DD (diverticular disease) 04/29/2015  . Backhand tennis elbow 04/29/2015  . Hemorrhoid 04/29/2015  . Indrawn nipple 04/29/2015  . 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 ANA positive 04/29/2015  . Avitaminosis D 04/29/2015  . Herpes zona 04/29/2015  . Asthma, exogenous 02/27/2009  . Cervical pain 02/27/2009  . Apnea, sleep 12/16/2008  . Diabetes mellitus type 2, uncontrolled ([place]) 05/30/2006  . Hypercholesterolemia without hypertriglyceridemia 05/30/2006   Past Medical History:  Diagnosis Date  . Allergic rhinitis, seasonal   . Diabetes mellitus without complication ([place])   . Hyperlipidemia   . Hypertension   . Polyp of colon   . Sleep apnea  Medications: Outpatient Medications Prior to Visit  Medication Sig  . albuterol (PROVENTIL HFA;VENTOLIN HFA) 108 (90 Base) MCG/ACT inhaler Inhale 1-2 puffs into the lungs every 6 (six) hours as needed.  . ALPRAZolam (XANAX) 0.5 MG tablet TAKE 1/2 TO 1 TABLET BY MOUTH 3 TIMES DAILY AS NEEDED FOR ANXIETY  . aspirin 81 MG tablet Take 81 mg by mouth daily.  .  benazepril (LOTENSIN) 10 MG tablet Take 1 tablet (10 mg total) by mouth daily.  Marland Kitchen ezetimibe (ZETIA) 10 MG tablet Take 1 tablet (10 mg total) by mouth daily.  . fluticasone (FLONASE) 50 MCG/ACT nasal spray Place 2 sprays into both nostrils daily. (Patient taking differently: Place 2 sprays into both nostrils as needed.)  . furosemide (LASIX) 20 MG tablet Take 1 tablet (20 mg total) by mouth daily.  . insulin aspart (NOVOLOG) 100 UNIT/ML injection Inject into the skin. 16-18-20 three times daily  . Insulin Glargine 300 UNIT/ML SOPN INJECT 70 UNITS EVERY NIGHT AS DIRECTED  . loratadine (CLARITIN) 10 MG tablet Take 1 tablet by mouth daily as needed. Reported on 01/02/2016  . metFORMIN (GLUCOPHAGE) 1000 MG tablet Take 1000 mg in am and 1500 mg at bedtime  . Multiple Vitamin (MULTI-VITAMINS) TABS Take by mouth.  Glory Rosebush VERIO test strip   . pantoprazole (PROTONIX) 40 MG tablet Take 1 tablet (40 mg total) by mouth daily.  Marland Kitchen ULTICARE SHORT PEN NEEDLES 31G X 8 MM MISC USE AS DIRECTED FOUR TIMES DAILY  . Vitamin D, Ergocalciferol, (DRISDOL) 1.25 MG (50000 UNIT) CAPS capsule TAKE 1 CAPSULE BY MOUTH EACH WEEK   No facility-administered medications prior to visit.    Review of Systems  Constitutional: Negative.   Respiratory: Negative.   Cardiovascular: Negative.   Gastrointestinal: Negative.   Genitourinary: Negative for vaginal discharge.       Foul smelling urine  Neurological: Negative.   Psychiatric/Behavioral: Negative.     Last CBC Lab Results  Component Value Date   WBC 6.1 06/03/2020   HGB 11.8 06/03/2020   HCT 37.9 06/03/2020   MCV 81 06/03/2020   MCH 25.1 (L) 06/03/2020   RDW 13.5 06/03/2020   PLT 203 96/28/3662   Last metabolic panel Lab Results  Component Value Date   GLUCOSE 215 (H) 06/03/2020   NA 140 06/03/2020   K 4.4 06/03/2020   CL 100 06/03/2020   CO2 23 06/03/2020   BUN 15 06/03/2020   CREATININE 0.80 06/03/2020   GFRNONAA 79 06/03/2020   GFRAA 91  06/03/2020   CALCIUM 9.2 06/03/2020   PROT 7.0 06/03/2020   ALBUMIN 4.4 06/03/2020   LABGLOB 2.6 06/03/2020   AGRATIO 1.7 06/03/2020   BILITOT 0.3 06/03/2020   ALKPHOS 57 06/03/2020   AST 25 06/03/2020   ALT 39 (H) 06/03/2020      Objective    BP (!) 142/58 (BP Location: Right Arm, Patient Position: Sitting, Cuff Size: Large)   Pulse 86   Temp 98.2 F (36.8 C) (Oral)   Resp 16   Wt 213 lb 14.4 oz (97 kg)   BMI 39.12 kg/m  BP Readings from Last 3 Encounters:  12/03/20 (!) 142/58  09/17/20 124/79  06/06/20 (!) 143/74   Wt Readings from Last 3 Encounters:  12/03/20 213 lb 14.4 oz (97 kg)  09/17/20 216 lb 3.2 oz (98.1 kg)  06/06/20 212 lb 12.8 oz (96.5 kg)      Physical Exam Vitals reviewed.  Constitutional:      General: She is not in  acute distress.    Appearance: Normal appearance. She is well-developed and well-nourished. She is obese. She is not ill-appearing or diaphoretic.  HENT:     Head: Normocephalic and atraumatic.  Cardiovascular:     Rate and Rhythm: Normal rate and regular rhythm.     Pulses: Normal pulses.     Heart sounds: Normal heart sounds. No murmur heard. No friction rub. No gallop.   Pulmonary:     Effort: Pulmonary effort is normal. No respiratory distress.     Breath sounds: Normal breath sounds. No wheezing or rales.  Musculoskeletal:     Cervical back: Normal range of motion and neck supple.  Skin:    General: Skin is warm and dry.  Neurological:     General: No focal deficit present.     Mental Status: She is alert and oriented to person, place, and time. Mental status is at baseline.  Psychiatric:        Mood and Affect: Mood normal.        Thought Content: Thought content normal.      Results for orders placed or performed in visit on 12/03/20  Hemoglobin A1c  Result Value Ref Range   Hemoglobin A1C 8.1     Assessment & Plan     1. Abnormal urine odor UA normal, with exception of glucose which is a known finding.  - POCT  urinalysis dipstick  2. GAD (generalized anxiety disorder) Stable. Diagnosis pulled for medication refill. Continue current medical treatment plan. - ALPRAZolam (XANAX) 0.5 MG tablet; TAKE 1/2 TO 1 TABLET BY MOUTH 3 TIMES DAILY AS NEEDED FOR ANXIETY  Dispense: 90 tablet; Refill: 5  3. Bilateral edema of lower extremity Stable. Diagnosis pulled for medication refill. Continue current medical treatment plan. - furosemide (LASIX) 20 MG tablet; Take 1 tablet (20 mg total) by mouth daily.  Dispense: 90 tablet; Refill: 1  4. Primary hypertension Stable. Continue current medication regiment.   5. Uncontrolled type 2 diabetes mellitus with hyperglycemia (HCC) Last A1c was 8.1 2 months ago, followed by Endocrinology  6. Diabetic polyneuropathy associated with type 2 diabetes mellitus ([place]) See above medical treatment plan.  7. BV (bacterial vaginosis) Suspect BV due to symptoms. Will give Metronidazole as below. Call if not improving.  - metroNIDAZOLE (FLAGYL) 500 MG tablet; Take 1 tablet (500 mg total) by mouth 2 (two) times daily.  Dispense: 14 tablet; Refill: 0   No follow-ups on file.      Reynolds Bowl, PA-C, have reviewed all documentation for this visit. The documentation on 12/03/20 for the exam, diagnosis, procedures, and orders are all accurate and complete.   Rubye Beach  [facility] [phone] (phone) [phone] (fax)  [place]

## 2020-12-03 ENCOUNTER — Encounter: Payer: Self-pay | Admitting: Physician Assistant

## 2020-12-03 ENCOUNTER — Other Ambulatory Visit: Payer: Self-pay

## 2020-12-03 ENCOUNTER — Ambulatory Visit: Payer: BC Managed Care – PPO | Admitting: Physician Assistant

## 2020-12-03 VITALS — BP 142/58 | HR 86 | Temp 98.2°F | Resp 16 | Wt 213.9 lb

## 2020-12-03 DIAGNOSIS — N760 Acute vaginitis: Secondary | ICD-10-CM

## 2020-12-03 DIAGNOSIS — I10 Essential (primary) hypertension: Secondary | ICD-10-CM

## 2020-12-03 DIAGNOSIS — B9689 Other specified bacterial agents as the cause of diseases classified elsewhere: Secondary | ICD-10-CM

## 2020-12-03 DIAGNOSIS — E1165 Type 2 diabetes mellitus with hyperglycemia: Secondary | ICD-10-CM

## 2020-12-03 DIAGNOSIS — F411 Generalized anxiety disorder: Secondary | ICD-10-CM

## 2020-12-03 DIAGNOSIS — R8290 Unspecified abnormal findings in urine: Secondary | ICD-10-CM

## 2020-12-03 DIAGNOSIS — R600 Localized edema: Secondary | ICD-10-CM

## 2020-12-03 DIAGNOSIS — E1142 Type 2 diabetes mellitus with diabetic polyneuropathy: Secondary | ICD-10-CM

## 2020-12-03 LAB — POCT URINALYSIS DIPSTICK
Bilirubin, UA: NEGATIVE
Blood, UA: NEGATIVE
Glucose, UA: NEGATIVE
Ketones, UA: NEGATIVE
Leukocytes, UA: NEGATIVE
Nitrite, UA: NEGATIVE
Protein, UA: NEGATIVE
Spec Grav, UA: 1.02 (ref 1.010–1.025)
Urobilinogen, UA: 0.2 E.U./dL
pH, UA: 5 (ref 5.0–8.0)

## 2020-12-03 MED ORDER — FUROSEMIDE 20 MG PO TABS: 20.0000 mg | ORAL_TABLET | Freq: Every day | ORAL | 1 refills | Status: DC

## 2020-12-03 MED ORDER — METRONIDAZOLE 500 MG PO TABS: 500.0000 mg | ORAL_TABLET | Freq: Two times a day (BID) | ORAL | 0 refills | Status: DC

## 2020-12-03 MED ORDER — ALPRAZOLAM 0.5 MG PO TABS: ORAL_TABLET | ORAL | 5 refills | Status: DC

## 2020-12-04 ENCOUNTER — Ambulatory Visit: Payer: Self-pay | Admitting: Physician Assistant

## 2020-12-31 DIAGNOSIS — E1165 Type 2 diabetes mellitus with hyperglycemia: Secondary | ICD-10-CM | POA: Diagnosis not present

## 2021-01-02 ENCOUNTER — Other Ambulatory Visit: Payer: Self-pay | Admitting: Physician Assistant

## 2021-01-02 DIAGNOSIS — K219 Gastro-esophageal reflux disease without esophagitis: Secondary | ICD-10-CM

## 2021-01-24 ENCOUNTER — Other Ambulatory Visit: Payer: Self-pay | Admitting: Physician Assistant

## 2021-01-24 DIAGNOSIS — E1169 Type 2 diabetes mellitus with other specified complication: Secondary | ICD-10-CM

## 2021-01-29 ENCOUNTER — Encounter: Payer: Self-pay | Admitting: Physician Assistant

## 2021-01-29 DIAGNOSIS — B9689 Other specified bacterial agents as the cause of diseases classified elsewhere: Secondary | ICD-10-CM

## 2021-01-29 DIAGNOSIS — N760 Acute vaginitis: Secondary | ICD-10-CM

## 2021-01-30 MED ORDER — METRONIDAZOLE 500 MG PO TABS: 500.0000 mg | ORAL_TABLET | Freq: Two times a day (BID) | ORAL | 0 refills | Status: DC

## 2021-02-03 DIAGNOSIS — E113293 Type 2 diabetes mellitus with mild nonproliferative diabetic retinopathy without macular edema, bilateral: Secondary | ICD-10-CM | POA: Diagnosis not present

## 2021-02-03 LAB — HM DIABETES EYE EXAM

## 2021-02-12 ENCOUNTER — Other Ambulatory Visit: Payer: Self-pay

## 2021-02-12 ENCOUNTER — Ambulatory Visit: Payer: BC Managed Care – PPO | Admitting: Physician Assistant

## 2021-02-12 ENCOUNTER — Encounter: Payer: Self-pay | Admitting: Physician Assistant

## 2021-02-12 VITALS — BP 128/76 | HR 85 | Temp 97.5°F | Resp 16 | Wt 208.0 lb

## 2021-02-12 DIAGNOSIS — I10 Essential (primary) hypertension: Secondary | ICD-10-CM | POA: Diagnosis not present

## 2021-02-12 DIAGNOSIS — E7800 Pure hypercholesterolemia, unspecified: Secondary | ICD-10-CM

## 2021-02-12 DIAGNOSIS — H6123 Impacted cerumen, bilateral: Secondary | ICD-10-CM | POA: Diagnosis not present

## 2021-02-12 NOTE — Progress Notes (Signed)
Established patient visit   Patient: Jessica Elliott   DOB: 1956/03/31   65 y.o. Female  MRN: [id number] Visit Date: 02/12/2021  Today's healthcare provider: Mar Daring, PA-C   Chief Complaint  Patient presents with  . Ear Fullness   Subjective    Ear Fullness  There is pain in the right ear. This is a new problem. Episode onset: 3 days ago. The problem has been gradually worsening. There has been no fever. Associated symptoms include hearing loss. Pertinent negatives include no abdominal pain or vomiting. She has tried ear drops for the symptoms. The treatment provided no relief.     Patient Active Problem List   Diagnosis Date Noted  . Claudication of both lower extremities ([place]) 06/02/2020  . Hypertension   . Swelling of limb 05/13/2020  . Pain in limb 05/13/2020  . Bilateral carpal tunnel syndrome 01/17/2018  . History of colonic polyps   . Benign neoplasm of transverse colon   . Benign neoplasm of ascending colon   . Elbow pain, chronic 10/10/2015  . Myalgia 10/10/2015  . Diabetic neuropathy ([place]) 07/11/2015  . Dizziness 06/25/2015  . Bilateral leg edema 06/25/2015  . Middle insomnia 06/25/2015  . Absolute anemia 04/29/2015  . Anxiety 04/29/2015  . Colon polyp 04/29/2015  . DD (diverticular disease) 04/29/2015  . Backhand tennis elbow 04/29/2015  . Hemorrhoid 04/29/2015  . Indrawn nipple 04/29/2015  . 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 ANA positive 04/29/2015  . Avitaminosis D 04/29/2015  . Herpes zona 04/29/2015  . Asthma, exogenous 02/27/2009  . Cervical pain 02/27/2009  . Apnea, sleep 12/16/2008  . Diabetes mellitus type 2, uncontrolled ([place]) 05/30/2006  . Hypercholesterolemia without hypertriglyceridemia 05/30/2006   Past Medical History:  Diagnosis Date  . Allergic rhinitis, seasonal   . Diabetes mellitus without complication ([place])   . Hyperlipidemia   . Hypertension   . Polyp of colon   . Sleep apnea         Medications: Outpatient Medications Prior to Visit  Medication Sig  . albuterol (PROVENTIL HFA;VENTOLIN HFA) 108 (90 Base) MCG/ACT inhaler Inhale 1-2 puffs into the lungs every 6 (six) hours as needed.  . ALPRAZolam (XANAX) 0.5 MG tablet TAKE 1/2 TO 1 TABLET BY MOUTH 3 TIMES DAILY AS NEEDED FOR ANXIETY  . aspirin 81 MG tablet Take 81 mg by mouth daily.  . benazepril (LOTENSIN) 10 MG tablet Take 1 tablet (10 mg total) by mouth daily.  Marland Kitchen ezetimibe (ZETIA) 10 MG tablet Take 1 tablet (10 mg total) by mouth daily.  . fluticasone (FLONASE) 50 MCG/ACT nasal spray Place 2 sprays into both nostrils daily. (Patient taking differently: Place 2 sprays into both nostrils as needed.)  . furosemide (LASIX) 20 MG tablet Take 1 tablet (20 mg total) by mouth daily.  . insulin aspart (NOVOLOG) 100 UNIT/ML injection Inject into the skin. 16-18-20 three times daily  . Insulin Glargine 300 UNIT/ML SOPN INJECT 70 UNITS EVERY NIGHT AS DIRECTED  . loratadine (CLARITIN) 10 MG tablet Take 1 tablet by mouth daily as needed. Reported on 01/02/2016  . metFORMIN (GLUCOPHAGE) 1000 MG tablet Take 1000 mg in am and 1500 mg at bedtime  . metroNIDAZOLE (FLAGYL) 500 MG tablet Take 1 tablet (500 mg total) by mouth 2 (two) times daily.  . Multiple Vitamin (MULTI-VITAMINS) TABS Take by mouth.  Glory Rosebush VERIO test strip   . pantoprazole (PROTONIX) 40 MG tablet TAKE ONE TABLET BY MOUTH EVERY DAY  . Flossie Buffy  SHORT PEN NEEDLES 31G X 8 MM MISC USE AS DIRECTED FOUR TIMES DAILY  . Vitamin D, Ergocalciferol, (DRISDOL) 1.25 MG (50000 UNIT) CAPS capsule TAKE 1 CAPSULE BY MOUTH EACH WEEK   No facility-administered medications prior to visit.    Review of Systems  Constitutional: Negative for appetite change, chills, fatigue and fever.  HENT: Positive for ear pain (fullness) and hearing loss.   Respiratory: Negative for chest tightness and shortness of breath.   Cardiovascular: Negative for chest pain and palpitations.   Gastrointestinal: Negative for abdominal pain, nausea and vomiting.  Neurological: Negative for dizziness and weakness.    Last CBC Lab Results  Component Value Date   WBC 6.1 06/03/2020   HGB 11.8 06/03/2020   HCT 37.9 06/03/2020   MCV 81 06/03/2020   MCH 25.1 (L) 06/03/2020   RDW 13.5 06/03/2020   PLT 203 70/62/3762   Last metabolic panel Lab Results  Component Value Date   GLUCOSE 125 (H) 02/13/2021   NA 144 02/13/2021   K 4.2 02/13/2021   CL 104 02/13/2021   CO2 23 02/13/2021   BUN 14 02/13/2021   CREATININE 0.72 02/13/2021   GFRNONAA 89 02/13/2021   GFRAA 103 02/13/2021   CALCIUM 9.4 02/13/2021   PROT 7.0 02/13/2021   ALBUMIN 4.4 02/13/2021   LABGLOB 2.6 02/13/2021   AGRATIO 1.7 02/13/2021   BILITOT 0.2 02/13/2021   ALKPHOS 62 02/13/2021   AST 22 02/13/2021   ALT 31 02/13/2021       Objective    BP 128/76 (BP Location: Left Arm)   Pulse 85   Temp (!) 97.5 F (36.4 C) (Oral)   Resp 16   Wt 208 lb (94.3 kg)   BMI 38.04 kg/m  BP Readings from Last 3 Encounters:  02/12/21 128/76  12/03/20 (!) 142/58  09/17/20 124/79   Wt Readings from Last 3 Encounters:  02/12/21 208 lb (94.3 kg)  12/03/20 213 lb 14.4 oz (97 kg)  09/17/20 216 lb 3.2 oz (98.1 kg)       Physical Exam Vitals reviewed.  Constitutional:      General: She is not in acute distress.    Appearance: Normal appearance. She is well-developed and well-nourished. She is obese. She is not ill-appearing.  HENT:     Head: Normocephalic and atraumatic.     Right Ear: Hearing, tympanic membrane and external ear normal. There is impacted cerumen.     Left Ear: Hearing, tympanic membrane and external ear normal. There is impacted cerumen.     Nose: Nose normal.     Mouth/Throat:     Mouth: Oropharynx is clear and moist. Mucous membranes are moist.     Pharynx: Oropharynx is clear. No oropharyngeal exudate or posterior oropharyngeal erythema.  Eyes:     General:        Right eye: No discharge.         Left eye: No discharge.     Extraocular Movements: Extraocular movements intact and EOM normal.     Conjunctiva/sclera: Conjunctivae normal.     Pupils: Pupils are equal, round, and reactive to light.  Neck:     Thyroid: No thyromegaly.     Vascular: No JVD.     Trachea: No tracheal deviation.     Meningeal: Brudzinski's sign and Kernig's sign absent.  Cardiovascular:     Rate and Rhythm: Normal rate and regular rhythm.     Heart sounds: Normal heart sounds. No murmur heard. No friction rub. No gallop.  Pulmonary:     Effort: Pulmonary effort is normal. No respiratory distress.     Breath sounds: Normal breath sounds. No stridor. No wheezing or rales.  Chest:     Chest wall: No tenderness.  Musculoskeletal:     Cervical back: Normal range of motion and neck supple. No tenderness.  Lymphadenopathy:     Cervical: No cervical adenopathy.  Skin:    General: Skin is warm and dry.  Neurological:     Mental Status: She is alert.      No results found for any visits on 02/12/21.  Assessment & Plan     1. Bilateral impacted cerumen Bilateral cerumen impaction. Lavage successful. TM normal.  - Ear Lavage  2. Primary hypertension Stable. Continue current medical treatment plan. Will check labs as below and f/u pending results. - Lipid Panel With LDL/HDL Ratio - Comprehensive Metabolic Panel (CMET)  3. Hypercholesterolemia without hypertriglyceridemia Stable. Recently started Zetia. Will check labs as below and f/u pending results. - Lipid Panel With LDL/HDL Ratio - Comprehensive Metabolic Panel (CMET)   No follow-ups on file.      Reynolds Bowl, PA-C, have reviewed all documentation for this visit. The documentation on 02/17/21 for the exam, diagnosis, procedures, and orders are all accurate and complete.   Rubye Beach  [facility] [phone] (phone) [phone] (fax)  [place]

## 2021-02-12 NOTE — Patient Instructions (Signed)

## 2021-02-13 DIAGNOSIS — I10 Essential (primary) hypertension: Secondary | ICD-10-CM | POA: Diagnosis not present

## 2021-02-13 DIAGNOSIS — E7800 Pure hypercholesterolemia, unspecified: Secondary | ICD-10-CM | POA: Diagnosis not present

## 2021-02-14 LAB — LIPID PANEL WITH LDL/HDL RATIO
Cholesterol, Total: 171 mg/dL (ref 100–199)
HDL: 50 mg/dL (ref >39)
LDL Chol Calc (NIH): 98 mg/dL (ref 0–99)
LDL/HDL Ratio: 2 ratio (ref 0.0–3.2)
Triglycerides: 132 mg/dL (ref 0–149)
VLDL Cholesterol Cal: 23 mg/dL (ref 5–40)

## 2021-02-14 LAB — COMPREHENSIVE METABOLIC PANEL
ALT: 31 IU/L (ref 0–32)
AST: 22 IU/L (ref 0–40)
Albumin/Globulin Ratio: 1.7 (ref 1.2–2.2)
Albumin: 4.4 g/dL (ref 3.8–4.8)
Alkaline Phosphatase: 62 IU/L (ref 44–121)
BUN/Creatinine Ratio: 19 (ref 12–28)
BUN: 14 mg/dL (ref 8–27)
Bilirubin Total: 0.2 mg/dL (ref 0.0–1.2)
CO2: 23 mmol/L (ref 20–29)
Calcium: 9.4 mg/dL (ref 8.7–10.3)
Chloride: 104 mmol/L (ref 96–106)
Creatinine, Ser: 0.72 mg/dL (ref 0.57–1.00)
GFR calc Af Amer: 103 mL/min/{1.73_m2} (ref >59)
GFR calc non Af Amer: 89 mL/min/{1.73_m2} (ref >59)
Globulin, Total: 2.6 g/dL (ref 1.5–4.5)
Glucose: 125 mg/dL — ABNORMAL HIGH (ref 65–99)
Potassium: 4.2 mmol/L (ref 3.5–5.2)
Sodium: 144 mmol/L (ref 134–144)
Total Protein: 7 g/dL (ref 6.0–8.5)

## 2021-02-17 ENCOUNTER — Encounter: Payer: Self-pay | Admitting: Physician Assistant

## 2021-02-18 ENCOUNTER — Encounter: Payer: Self-pay | Admitting: Physician Assistant

## 2021-03-24 ENCOUNTER — Other Ambulatory Visit: Payer: Self-pay | Admitting: Physician Assistant

## 2021-03-24 NOTE — Telephone Encounter (Signed)
Requested medication (s) are due for refill today: yes  Requested medication (s) are on the active medication list: yes  Last refill:  02/20/2021  Future visit scheduled:no  Notes to clinic:  50,000 IU strengths are not delegated   Requested Prescriptions  Pending Prescriptions Disp Refills   Vitamin D, Ergocalciferol, (DRISDOL) 1.25 MG (50000 UNIT) CAPS capsule [Pharmacy Med Name: VITAMIN D (ERGOCALCIFEROL) 1.25 MG] 4 capsule 5    Sig: TAKE 1 CAPSULE BY MOUTH [place]      Endocrinology:  Vitamins - Vitamin D Supplementation Failed - 03/24/2021 10:49 AM      Failed - 50,000 IU strengths are not delegated      Failed - Phosphate in normal range and within 360 days    No results found for: PHOS        Passed - Ca in normal range and within 360 days    Calcium  Date Value Ref Range Status  02/13/2021 9.4 8.7 - 10.3 mg/dL Final          Passed - Vitamin D in normal range and within 360 days    Vit D, 25-Hydroxy  Date Value Ref Range Status  06/03/2020 38.7 30.0 - 100.0 ng/mL Final    Comment:    Vitamin D deficiency has been defined by the Institute of Medicine and an Endocrine Society practice guideline as a level of serum 25-OH vitamin D less than 20 ng/mL (1,2). The Endocrine Society went on to further define vitamin D insufficiency as a level between 21 and 29 ng/mL (2). 1. IOM (Institute of Medicine). 2010. Dietary reference    intakes for calcium and D. [place]: The    [employer]. 2. Holick MF, Binkley Brush, Bischoff-Ferrari HA, et al.    Evaluation, treatment, and prevention of vitamin D    deficiency: an Endocrine Society clinical practice    guideline. JCEM. 2011 Jul; 96(7):1911-30.           Passed - Valid encounter within last 12 months    Recent Outpatient Visits           1 month ago Bilateral impacted cerumen   [place], [place], [place]   3 months ago Abnormal urine odor   [facility]  [place], [place]   6 months ago Dyslipidemia with low high density lipoprotein (HDL) cholesterol with hypertriglyceridemia due to type 2 diabetes mellitus [facility])   [place], [place]   9 months ago Hypercholesterolemia without hypertriglyceridemia   [facility], Clearnce Sorrel, [place]   2 years ago Abscess   [facility], Dionne Bucy, [place]

## 2021-03-24 NOTE — Telephone Encounter (Signed)
Please review. Thanks!  

## 2021-04-10 ENCOUNTER — Other Ambulatory Visit: Payer: Self-pay | Admitting: Family Medicine

## 2021-04-10 DIAGNOSIS — E782 Mixed hyperlipidemia: Secondary | ICD-10-CM

## 2021-04-10 DIAGNOSIS — E1169 Type 2 diabetes mellitus with other specified complication: Secondary | ICD-10-CM

## 2021-04-10 MED ORDER — EZETIMIBE 10 MG PO TABS
10.0000 mg | ORAL_TABLET | Freq: Every day | ORAL | 1 refills | Status: AC
Start: 2021-04-10 — End: ?

## 2021-04-10 NOTE — Telephone Encounter (Signed)
Medication Refill - Medication: ezetimibe (ZETIA) 10 MG tablet   Pt is completely out of her current supply  Has the patient contacted their pharmacy? No. (Agent: If no, request that the patient contact the pharmacy for the refill.) (Agent: If yes, when and what did the pharmacy advise?)  Preferred Pharmacy (with phone number or street name):  [place], [place] - [address]  [place] [address]  Phone: [phone] Fax: [phone]     Agent: Please be advised that RX refills may take up to 3 business days. We ask that you follow-up with your pharmacy.

## 2021-04-24 DIAGNOSIS — E1169 Type 2 diabetes mellitus with other specified complication: Secondary | ICD-10-CM | POA: Diagnosis not present

## 2021-04-24 DIAGNOSIS — I10 Essential (primary) hypertension: Secondary | ICD-10-CM | POA: Diagnosis not present

## 2021-04-24 DIAGNOSIS — E113293 Type 2 diabetes mellitus with mild nonproliferative diabetic retinopathy without macular edema, bilateral: Secondary | ICD-10-CM | POA: Diagnosis not present

## 2021-04-24 DIAGNOSIS — E785 Hyperlipidemia, unspecified: Secondary | ICD-10-CM | POA: Diagnosis not present

## 2021-06-10 ENCOUNTER — Other Ambulatory Visit: Payer: Self-pay | Admitting: Physician Assistant

## 2021-06-10 DIAGNOSIS — F411 Generalized anxiety disorder: Secondary | ICD-10-CM

## 2021-06-11 DIAGNOSIS — R399 Unspecified symptoms and signs involving the genitourinary system: Secondary | ICD-10-CM | POA: Diagnosis not present

## 2021-06-11 NOTE — Telephone Encounter (Signed)
Patient is requesting refills. She is currently out of medication. Former Sports administrator pt. Please review. Thanks!

## 2021-06-19 ENCOUNTER — Other Ambulatory Visit: Payer: Self-pay | Admitting: Physician Assistant

## 2021-06-19 DIAGNOSIS — R600 Localized edema: Secondary | ICD-10-CM

## 2021-06-23 ENCOUNTER — Other Ambulatory Visit: Payer: Self-pay | Admitting: Physician Assistant

## 2021-06-23 DIAGNOSIS — K219 Gastro-esophageal reflux disease without esophagitis: Secondary | ICD-10-CM

## 2021-08-01 ENCOUNTER — Other Ambulatory Visit: Payer: Self-pay | Admitting: Family Medicine

## 2021-08-01 DIAGNOSIS — E1169 Type 2 diabetes mellitus with other specified complication: Secondary | ICD-10-CM

## 2021-08-01 DIAGNOSIS — E782 Mixed hyperlipidemia: Secondary | ICD-10-CM

## 2021-08-01 NOTE — Telephone Encounter (Signed)
Last RF 04/10/21 #90 1 RF

## 2021-08-04 DIAGNOSIS — M7552 Bursitis of left shoulder: Secondary | ICD-10-CM | POA: Diagnosis not present

## 2021-08-04 DIAGNOSIS — M25512 Pain in left shoulder: Secondary | ICD-10-CM | POA: Diagnosis not present

## 2021-08-04 DIAGNOSIS — M7542 Impingement syndrome of left shoulder: Secondary | ICD-10-CM | POA: Diagnosis not present

## 2021-08-04 DIAGNOSIS — M778 Other enthesopathies, not elsewhere classified: Secondary | ICD-10-CM | POA: Diagnosis not present

## 2021-08-17 ENCOUNTER — Encounter: Payer: Self-pay | Admitting: Family Medicine

## 2021-08-17 ENCOUNTER — Ambulatory Visit: Payer: BC Managed Care – PPO | Admitting: Family Medicine

## 2021-08-17 ENCOUNTER — Other Ambulatory Visit: Payer: Self-pay

## 2021-08-17 VITALS — BP 121/55 | HR 84 | Temp 98.1°F | Ht 62.0 in | Wt 209.0 lb

## 2021-08-17 DIAGNOSIS — F411 Generalized anxiety disorder: Secondary | ICD-10-CM | POA: Insufficient documentation

## 2021-08-17 DIAGNOSIS — E1159 Type 2 diabetes mellitus with other circulatory complications: Secondary | ICD-10-CM

## 2021-08-17 DIAGNOSIS — E1169 Type 2 diabetes mellitus with other specified complication: Secondary | ICD-10-CM | POA: Diagnosis not present

## 2021-08-17 DIAGNOSIS — R8290 Unspecified abnormal findings in urine: Secondary | ICD-10-CM

## 2021-08-17 DIAGNOSIS — I152 Hypertension secondary to endocrine disorders: Secondary | ICD-10-CM

## 2021-08-17 DIAGNOSIS — E1142 Type 2 diabetes mellitus with diabetic polyneuropathy: Secondary | ICD-10-CM

## 2021-08-17 DIAGNOSIS — E559 Vitamin D deficiency, unspecified: Secondary | ICD-10-CM

## 2021-08-17 DIAGNOSIS — H6123 Impacted cerumen, bilateral: Secondary | ICD-10-CM

## 2021-08-17 DIAGNOSIS — F5104 Psychophysiologic insomnia: Secondary | ICD-10-CM | POA: Insufficient documentation

## 2021-08-17 DIAGNOSIS — E785 Hyperlipidemia, unspecified: Secondary | ICD-10-CM

## 2021-08-17 NOTE — Assessment & Plan Note (Addendum)
-   Chronic and well-controlled - Continue medications as prescribed - Continue following with endocrinology - UTD on eye exam & foot exams - Currently taking an ACE inhibitor

## 2021-08-17 NOTE — Assessment & Plan Note (Signed)
-   Chronic, previously stable - Continue Vitamin D supplement - Recheck Vitamin D today

## 2021-08-17 NOTE — Assessment & Plan Note (Signed)
-   Chronic and poorly controlled - Discussed the risks of Xanax use in patients 49 and older - Discussed safer medication options, including SSRIs - Pt. states that she's adjusting to the loss of her husband and declines medication changes at this time, but may reconsider at subsequent visit - Pt. declines therapy referral at this time, but may reconsider at subsequent visit - Continue Xanax prn for anxiety

## 2021-08-17 NOTE — Progress Notes (Signed)
Established patient visit   Patient: Jessica Elliott   DOB: Apr 02, 1956   65 y.o. Female  MRN: [id number] Visit Date: 08/17/2021  Today's healthcare provider: Lavon Paganini, MD   Chief Complaint  Patient presents with   Medication Refill   Subjective    Type 2 Diabetes - Following with Endocrinology - UTD on annual eye & foot exams - States that BG at home ranges 110-130s - Currently taking Metformin, Novolog, & Glargine, denies recent episodes of hypoglycemia  Hypercholesterolemia - Currently taking Ezetimibe, denies side effects  Hypertension - Currently taking Benazepril & Lasix, denies side effects - Denies chest pain, SOB, leg swelling, vision changes, & headaches  Left Otalgia - Pt. reports intermittent otalgia in L ear for the past 6 weeks - Treated for bilateral cerumen impaction at previous visit - Pt. wonders if L ear pain is due to cerumen impaction 2/2 environmental allergies   Anxiety - Pt. states that she uses Xanax daily for anxiety & insomnia, worse since her husband passed away 5 mos ago - Pt. has never used any other medications for anxiety or insomnia   Malodorous Urine - Pt. reports concerns of intermittent urinary odor - Was previously treated with Flagyl, which pt. felt was helpful - Denies vaginal itching, dysuria, urinary frequency, urinary urgency, fevers, & suprapubic pain    Medications: Outpatient Medications Prior to Visit  Medication Sig   albuterol (PROVENTIL HFA;VENTOLIN HFA) 108 (90 Base) MCG/ACT inhaler Inhale 1-2 puffs into the lungs every 6 (six) hours as needed.   ALPRAZolam (XANAX) 0.5 MG tablet TAKE 1/2 TO 1 TABLET BY MOUTH 3 TIMES ASNEEDED FOR ANXIETY.   aspirin 81 MG tablet Take 81 mg by mouth daily.   benazepril (LOTENSIN) 10 MG tablet Take 1 tablet (10 mg total) by mouth daily.   ezetimibe (ZETIA) 10 MG tablet Take 1 tablet (10 mg total) by mouth daily.   fluticasone (FLONASE) 50 MCG/ACT nasal spray Place 2 sprays  into both nostrils daily. (Patient taking differently: Place 2 sprays into both nostrils as needed.)   furosemide (LASIX) 20 MG tablet TAKE 1 TABLET BY MOUTH DAILY.   insulin aspart (NOVOLOG) 100 UNIT/ML injection Inject into the skin. 16-18-20 three times daily   Insulin Glargine 300 UNIT/ML SOPN INJECT 70 UNITS EVERY NIGHT AS DIRECTED   loratadine (CLARITIN) 10 MG tablet Take 1 tablet by mouth daily as needed. Reported on 01/02/2016   Multiple Vitamin (MULTI-VITAMINS) TABS Take by mouth.   ONETOUCH VERIO test strip    pantoprazole (PROTONIX) 40 MG tablet TAKE ONE TABLET BY MOUTH EVERY DAY   ULTICARE SHORT PEN NEEDLES 31G X 8 MM MISC USE AS DIRECTED FOUR TIMES DAILY   Vitamin D, Ergocalciferol, (DRISDOL) 1.25 MG (50000 UNIT) CAPS capsule TAKE 1 CAPSULE BY MOUTH EACH WEEK   [DISCONTINUED] metFORMIN (GLUCOPHAGE) 1000 MG tablet Take 1000 mg in am and 1500 mg at bedtime (Patient not taking: Reported on 08/17/2021)   [DISCONTINUED] metroNIDAZOLE (FLAGYL) 500 MG tablet Take 1 tablet (500 mg total) by mouth 2 (two) times daily. (Patient not taking: Reported on 08/17/2021)   No facility-administered medications prior to visit.    Review of Systems  Constitutional:  Negative for activity change, appetite change, fatigue and fever.  HENT:  Positive for ear pain. Negative for ear discharge, hearing loss and sinus pain.   Eyes: Negative.  Negative for visual disturbance.  Respiratory: Negative.  Negative for chest tightness and shortness of breath.   Cardiovascular: Negative.  Negative for chest pain and leg swelling.  Gastrointestinal: Negative.   Endocrine: Negative.  Negative for polydipsia and polyuria.  Genitourinary:  Negative for difficulty urinating, dysuria and urgency.       Malodorous urine  Musculoskeletal: Negative.   Skin: Negative.   Neurological: Negative.  Negative for dizziness.  Psychiatric/Behavioral:  Positive for sleep disturbance.    {Labs  Heme  Chem  Endocrine  Serology   Results Review (optional):23779}   Objective    BP (!) 121/55 (BP Location: Right Arm, Patient Position: Sitting, Cuff Size: Large)   Pulse 84   Temp 98.1 F (36.7 C) (Oral)   Ht '5\' 2"'$  (1.575 m)   Wt 209 lb (94.8 kg)   SpO2 99%   BMI 38.23 kg/m     Physical Exam Constitutional:      General: She is not in acute distress.    Appearance: Normal appearance. She is obese.  HENT:     Head: Normocephalic and atraumatic.     Right Ear: Tympanic membrane, ear canal and external ear normal. There is impacted cerumen.     Left Ear: Tympanic membrane, ear canal and external ear normal. There is impacted cerumen.  Eyes:     Conjunctiva/sclera: Conjunctivae normal.  Cardiovascular:     Rate and Rhythm: Normal rate and regular rhythm.     Pulses: Normal pulses.     Heart sounds: Normal heart sounds.  Pulmonary:     Effort: Pulmonary effort is normal.     Breath sounds: Normal breath sounds.  Abdominal:     General: Abdomen is flat. Bowel sounds are normal.     Palpations: Abdomen is soft.  Skin:    General: Skin is warm and dry.  Neurological:     Mental Status: She is alert.  Psychiatric:        Behavior: Behavior normal.        Thought Content: Thought content normal.     No results found for any visits on 08/17/21.  Assessment & Plan     Problem List Items Addressed This Visit       Cardiovascular and Mediastinum   Hypertension associated with diabetes ([place])    - Chronic and well-controlled - Continue Benazepril & Lasix - Recheck CMP today      Relevant Orders   Comprehensive metabolic panel     Endocrine   Type 2 diabetes mellitus with diabetic polyneuropathy, without long-term current use of insulin (HCC)    - Chronic and well-controlled - Continue medications as prescribed - Continue following with endocrinology - UTD on eye exam & foot exams - Continue ACE inhibitor      Hyperlipidemia associated with type 2 diabetes mellitus ([facility])    - Chronic,  previously well-controlled - Continue Ezetimibe - Recheck lipid panel      Relevant Orders   Comprehensive metabolic panel   Lipid panel     Other   Avitaminosis D    - Chronic, previously stable - Continue Vitamin D supplement - Recheck Vitamin D today      Relevant Orders   VITAMIN D 25 Hydroxy (Vit-D Deficiency, Fractures)   GAD (generalized anxiety disorder)    - Chronic and poorly controlled - Discussed the risks of Xanax use in patients - Discussed safer medication options, including SSRIs - Pt. states that she's adjusting to the loss of her husband and declines medication changes at this time, but may reconsider at subsequent visit - Pt. declines therapy referral at this  time, but may reconsider at subsequent visit - Continue Xanax prn for anxiety - consider tapering at next visit      Psychophysiological insomnia    - Chronic and poorly controlled - Discussed the risks of Xanax use in patients 51 and older - Discussed safer medication options, including SSRIs and Trazodone - Pt. states that she's adjusting to the loss of her husband and declines medication changes at this time, but may reconsider at subsequent visit - Pt. declines therapy referral at this time, but may reconsider at subsequent visit - Continue Xanax prn for anxiety & insomnia      Morbid obesity ([place])    - Chronic obesity associated with Type 2 Diabetes, HTN, & HLD - Discussed diet & exercise - Discussed importance of maintaining healthy weight - Recheck lipid panel      Other Visit Diagnoses     Bilateral impacted cerumen       - Recurrent, uncomplicated problem - Successful lavage of bilateral ears in office today - Recommended pt. to continue using ear drops to soften ear wax prn for discomfort    Malodorous urine       - Acute, uncomplicated problem - Unremarkable UA in office today - Counseled pt. that malodorous urine may be due to intake of certain foods, beverages, and/or  medications - Counseled pt. to monitor for dysuria, vaginal itching, fevers, & suprapubic pain; contact office if these symptoms develop         Return in about 3 months (around 11/17/2021) for chronic disease f/u, With new PCP.       Percell Locus, MS3  Patient seen along with MS3 student Percell Locus. I personally evaluated this patient along with the student, and verified all aspects of the history, physical exam, and medical decision making as documented by the student. I agree with the student's documentation and have made all necessary edits.  Laurence Folz, Dionne Bucy, MD, MPH [facility]

## 2021-08-17 NOTE — Assessment & Plan Note (Signed)
-   Chronic, previously well-controlled - Continue Ezetimibe - Recheck lipid panel

## 2021-08-17 NOTE — Assessment & Plan Note (Addendum)
-   Chronic and well-controlled - Continue Benazepril & Lasix - Recheck CMP today

## 2021-08-17 NOTE — Assessment & Plan Note (Signed)
-   Chronic and poorly controlled - Discussed the risks of Xanax use in patients 61 and older - Discussed safer medication options, including SSRIs and Trazodone - Pt. states that she's adjusting to the loss of her husband and declines medication changes at this time, but may reconsider at subsequent visit - Pt. declines therapy referral at this time, but may reconsider at subsequent visit - Continue Xanax prn for anxiety & insomnia

## 2021-08-17 NOTE — Assessment & Plan Note (Signed)
-   Chronic obesity associated with Type 2 Diabetes, HTN, & HLD - Discussed diet & exercise - Discussed importance of maintaining healthy weight - Recheck lipid panel

## 2021-08-24 ENCOUNTER — Other Ambulatory Visit: Payer: Self-pay | Admitting: Physician Assistant

## 2021-08-31 ENCOUNTER — Other Ambulatory Visit: Payer: Self-pay | Admitting: Physician Assistant

## 2021-08-31 ENCOUNTER — Telehealth: Payer: Self-pay

## 2021-08-31 DIAGNOSIS — I10 Essential (primary) hypertension: Secondary | ICD-10-CM

## 2021-08-31 DIAGNOSIS — E1169 Type 2 diabetes mellitus with other specified complication: Secondary | ICD-10-CM | POA: Diagnosis not present

## 2021-08-31 DIAGNOSIS — E785 Hyperlipidemia, unspecified: Secondary | ICD-10-CM | POA: Diagnosis not present

## 2021-08-31 DIAGNOSIS — E559 Vitamin D deficiency, unspecified: Secondary | ICD-10-CM | POA: Diagnosis not present

## 2021-08-31 DIAGNOSIS — E1159 Type 2 diabetes mellitus with other circulatory complications: Secondary | ICD-10-CM | POA: Diagnosis not present

## 2021-08-31 DIAGNOSIS — I152 Hypertension secondary to endocrine disorders: Secondary | ICD-10-CM | POA: Diagnosis not present

## 2021-08-31 NOTE — Telephone Encounter (Signed)
Copied from [place] [id number]. Topic: Quick Communication - Rx Refill/Question >> Aug 31, 2021 12:16 PM Pawlus, Brayton Layman A wrote: Pt was following up on her refill request for benazepril (LOTENSIN) 10 MG tablet, please advise.

## 2021-09-01 ENCOUNTER — Other Ambulatory Visit: Payer: Self-pay

## 2021-09-01 DIAGNOSIS — I10 Essential (primary) hypertension: Secondary | ICD-10-CM

## 2021-09-01 LAB — COMPREHENSIVE METABOLIC PANEL
ALT: 22 IU/L (ref 0–32)
AST: 19 IU/L (ref 0–40)
Albumin/Globulin Ratio: 1.8 (ref 1.2–2.2)
Albumin: 4.5 g/dL (ref 3.8–4.8)
Alkaline Phosphatase: 59 IU/L (ref 44–121)
BUN/Creatinine Ratio: 14 (ref 12–28)
BUN: 11 mg/dL (ref 8–27)
Bilirubin Total: 0.3 mg/dL (ref 0.0–1.2)
CO2: 25 mmol/L (ref 20–29)
Calcium: 9.7 mg/dL (ref 8.7–10.3)
Chloride: 102 mmol/L (ref 96–106)
Creatinine, Ser: 0.76 mg/dL (ref 0.57–1.00)
Globulin, Total: 2.5 g/dL (ref 1.5–4.5)
Glucose: 170 mg/dL — ABNORMAL HIGH (ref 65–99)
Potassium: 4.2 mmol/L (ref 3.5–5.2)
Sodium: 141 mmol/L (ref 134–144)
Total Protein: 7 g/dL (ref 6.0–8.5)
eGFR: 88 mL/min/{1.73_m2} (ref >59)

## 2021-09-01 LAB — LIPID PANEL
Chol/HDL Ratio: 3.6 ratio (ref 0.0–4.4)
Cholesterol, Total: 176 mg/dL (ref 100–199)
HDL: 49 mg/dL (ref >39)
LDL Chol Calc (NIH): 103 mg/dL — ABNORMAL HIGH (ref 0–99)
Triglycerides: 136 mg/dL (ref 0–149)
VLDL Cholesterol Cal: 24 mg/dL (ref 5–40)

## 2021-09-01 LAB — VITAMIN D 25 HYDROXY (VIT D DEFICIENCY, FRACTURES): Vit D, 25-Hydroxy: 57.6 ng/mL (ref 30.0–100.0)

## 2021-09-01 MED ORDER — BENAZEPRIL HCL 10 MG PO TABS: 10.0000 mg | ORAL_TABLET | Freq: Every day | ORAL | 3 refills | Status: DC

## 2021-09-03 DIAGNOSIS — E1159 Type 2 diabetes mellitus with other circulatory complications: Secondary | ICD-10-CM | POA: Diagnosis not present

## 2021-09-03 DIAGNOSIS — R809 Proteinuria, unspecified: Secondary | ICD-10-CM | POA: Diagnosis not present

## 2021-09-03 DIAGNOSIS — E785 Hyperlipidemia, unspecified: Secondary | ICD-10-CM | POA: Diagnosis not present

## 2021-09-03 DIAGNOSIS — E1169 Type 2 diabetes mellitus with other specified complication: Secondary | ICD-10-CM | POA: Diagnosis not present

## 2021-09-03 DIAGNOSIS — E1129 Type 2 diabetes mellitus with other diabetic kidney complication: Secondary | ICD-10-CM | POA: Diagnosis not present

## 2021-09-03 DIAGNOSIS — Z794 Long term (current) use of insulin: Secondary | ICD-10-CM | POA: Diagnosis not present

## 2021-09-03 DIAGNOSIS — Z0000 Encounter for general adult medical examination without abnormal findings: Secondary | ICD-10-CM | POA: Diagnosis not present

## 2021-09-18 ENCOUNTER — Other Ambulatory Visit: Payer: Self-pay | Admitting: Family Medicine

## 2021-09-18 DIAGNOSIS — F411 Generalized anxiety disorder: Secondary | ICD-10-CM

## 2021-09-18 NOTE — Telephone Encounter (Signed)
Medication: ALPRAZolam (XANAX) 0.5 MG tablet [[id number]]   Has the patient contacted their pharmacy? YES Advised to contact the doctors office (Agent: If no, request that the patient contact the pharmacy for the refill.) (Agent: If yes, when and what did the pharmacy advise?)  Preferred Pharmacy (with phone number or street name): [facility] [address], [place]. [address]  Has the patient been seen for an appointment in the last year OR does the patient have an upcoming appointment? 08/17/21  Agent: Please be advised that RX refills may take up to 3 business days. We ask that you follow-up with your pharmacy.

## 2021-09-18 NOTE — Telephone Encounter (Signed)
Requested medication (s) are due for refill today:yes  Requested medication (s) are on the active medication list: yes  Last refill: 06/11/21  #90  0 refills  Future visit scheduled yes  11/17/21  Notes to clinic:  not delegated  Requested Prescriptions  Pending Prescriptions Disp Refills   ALPRAZolam (XANAX) 0.5 MG tablet 90 tablet 0    Sig: TAKE 1/2 TO 1 TABLET BY MOUTH 3 TIMES ASNEEDED FOR ANXIETY.     Not Delegated - Psychiatry:  Anxiolytics/Hypnotics Failed - 09/18/2021  5:57 PM      Failed - This refill cannot be delegated      Failed - Urine Drug Screen completed in last 360 days      Passed - Valid encounter within last 6 months    Recent Outpatient Visits           1 month ago Diabetic polyneuropathy associated with type 2 diabetes mellitus [facility])   [facility] [place], Dionne Bucy, MD   7 months ago Bilateral impacted cerumen   [facility] Fenton Malling M, [place]   9 months ago Abnormal urine odor   [place], [place], [place]   1 year ago Dyslipidemia with low high density lipoprotein (HDL) cholesterol with hypertriglyceridemia due to type 2 diabetes mellitus [facility])   [place], [place], [place]   1 year ago Hypercholesterolemia without hypertriglyceridemia   [facility], Clearnce [place], [place]       Future Appointments             In 2 months Rollene Rotunda, Jaci Standard, [place], [place]

## 2021-09-23 DIAGNOSIS — E113293 Type 2 diabetes mellitus with mild nonproliferative diabetic retinopathy without macular edema, bilateral: Secondary | ICD-10-CM | POA: Diagnosis not present

## 2021-09-23 DIAGNOSIS — E785 Hyperlipidemia, unspecified: Secondary | ICD-10-CM | POA: Diagnosis not present

## 2021-09-23 DIAGNOSIS — I10 Essential (primary) hypertension: Secondary | ICD-10-CM | POA: Diagnosis not present

## 2021-09-23 DIAGNOSIS — E1169 Type 2 diabetes mellitus with other specified complication: Secondary | ICD-10-CM | POA: Diagnosis not present

## 2021-09-28 ENCOUNTER — Telehealth: Payer: Self-pay

## 2021-09-28 NOTE — Telephone Encounter (Signed)
Pt contacted office for refill request on the following medications:  ALPRAZolam (XANAX) 0.5 MG tablet  Last Rx: 06/11/21 LOV: 08/17/21 Pt has upcoming office visit with Daneil Dan on 11/17/21 but pt is out of medication and had been requesting the medication since 09/18/21. Pt request it be sent to [facility]. Please advise. TNP

## 2021-09-29 NOTE — Telephone Encounter (Signed)
See patient message below. KW

## 2021-09-29 NOTE — Telephone Encounter (Signed)
Pt called and has requested to disregard this medication request. She states that she would like to be taken off as a patient at this office due to not feeling her care is being taken seriously since Ehrenfeld left. Please advise.

## 2021-09-29 NOTE — Telephone Encounter (Signed)
Pt called today saying she just seen Dr. Jacinto Reap 8/29 and does not understand why she can not get a refill on this medication.  She said Dr. B told her if she needed anything to call.  She did not know why she has an appt scheduled with Tally Joe.  She was not told of this.  She said she needs this refil asap.  She said please ask Dr. B.   CB#  [phone]

## 2021-10-02 NOTE — Telephone Encounter (Signed)
Patient reports she has transferred care. Will cancel future appt.

## 2021-10-02 NOTE — Telephone Encounter (Signed)
[place]. Patient should have been told that I was out for medical reasons instead of waiting for response on this. But can disregard refill if she is good for now.

## 2021-10-07 ENCOUNTER — Other Ambulatory Visit (HOSPITAL_COMMUNITY): Payer: Self-pay

## 2021-10-07 MED ORDER — FLUARIX QUADRIVALENT 0.5 ML IM SUSY
PREFILLED_SYRINGE | INTRAMUSCULAR | 0 refills | Status: DC
  Filled 2021-10-07: qty 0.5, 1d supply, fill #0

## 2021-10-29 ENCOUNTER — Other Ambulatory Visit: Payer: Self-pay | Admitting: Family Medicine

## 2021-10-29 DIAGNOSIS — E782 Mixed hyperlipidemia: Secondary | ICD-10-CM

## 2021-10-29 DIAGNOSIS — E1169 Type 2 diabetes mellitus with other specified complication: Secondary | ICD-10-CM

## 2021-10-29 NOTE — Telephone Encounter (Signed)
Requested medication (s) are due for refill today - yes  Requested medication (s) are on the active medication list -yes  Future visit scheduled -no  Last refill: 6 months age  Notes to clinic: Request RF: Sent for review- patient may have changed provider/practice   Requested Prescriptions  Pending Prescriptions Disp Refills   ezetimibe (ZETIA) 10 MG tablet [Pharmacy Med Name: EZETIMIBE 10 MG TAB] 90 tablet 1    Sig: TAKE 1 TABLET BY MOUTH DAILY     Cardiovascular:  Antilipid - Sterol Transport Inhibitors Failed - 10/29/2021  9:24 AM      Failed - LDL in normal range and within 360 days    LDL Chol Calc (NIH)  Date Value Ref Range Status  08/31/2021 103 (H) 0 - 99 mg/dL Final          Passed - Total Cholesterol in normal range and within 360 days    Cholesterol, Total  Date Value Ref Range Status  08/31/2021 176 100 - 199 mg/dL Final          Passed - HDL in normal range and within 360 days    HDL  Date Value Ref Range Status  08/31/2021 49 >39 mg/dL Final          Passed - Triglycerides in normal range and within 360 days    Triglycerides  Date Value Ref Range Status  08/31/2021 136 0 - 149 mg/dL Final          Passed - Valid encounter within last 12 months    Recent Outpatient Visits           2 months ago Diabetic polyneuropathy associated with type 2 diabetes mellitus ([place])   [facility] [place], Dionne Bucy, MD   8 months ago Bilateral impacted cerumen   [facility] Fenton Malling M, [place]   11 months ago Abnormal urine odor   [place], [place], [place]   1 year ago Dyslipidemia with low high density lipoprotein (HDL) cholesterol with hypertriglyceridemia due to type 2 diabetes mellitus [facility])   [place], [place], [place]   1 year ago Hypercholesterolemia without hypertriglyceridemia   [facility], [place] M, [place]                  Requested Prescriptions  Pending Prescriptions Disp Refills   ezetimibe (ZETIA) 10 MG tablet [Pharmacy Med Name: EZETIMIBE 10 MG TAB] 90 tablet 1    Sig: TAKE 1 TABLET BY MOUTH DAILY     Cardiovascular:  Antilipid - Sterol Transport Inhibitors Failed - 10/29/2021  9:24 AM      Failed - LDL in normal range and within 360 days    LDL Chol Calc (NIH)  Date Value Ref Range Status  08/31/2021 103 (H) 0 - 99 mg/dL Final          Passed - Total Cholesterol in normal range and within 360 days    Cholesterol, Total  Date Value Ref Range Status  08/31/2021 176 100 - 199 mg/dL Final          Passed - HDL in normal range and within 360 days    HDL  Date Value Ref Range Status  08/31/2021 49 >39 mg/dL Final          Passed - Triglycerides in normal range and within 360 days    Triglycerides  Date Value Ref Range Status  08/31/2021 136 0 - 149 mg/dL Final  Passed - Valid encounter within last 12 months    Recent Outpatient Visits           2 months ago Diabetic polyneuropathy associated with type 2 diabetes mellitus [facility])   [facility] [place], Dionne Bucy, MD   8 months ago Bilateral impacted cerumen   [facility] Fenton Malling M, [place]   11 months ago Abnormal urine odor   [facility] [place], [place], [place]   1 year ago Dyslipidemia with low high density lipoprotein (HDL) cholesterol with hypertriglyceridemia due to type 2 diabetes mellitus [facility])   [place], [place], [place]   1 year ago Hypercholesterolemia without hypertriglyceridemia   [facility], [place], [place]

## 2021-11-17 ENCOUNTER — Ambulatory Visit: Payer: BC Managed Care – PPO | Admitting: Family Medicine

## 2021-11-26 ENCOUNTER — Other Ambulatory Visit: Payer: Self-pay | Admitting: Infectious Diseases

## 2021-11-26 DIAGNOSIS — Z1231 Encounter for screening mammogram for malignant neoplasm of breast: Secondary | ICD-10-CM

## 2021-11-30 ENCOUNTER — Ambulatory Visit
Admission: RE | Admit: 2021-11-30 | Discharge: 2021-11-30 | Disposition: A | Discharge status: Home / Self Care | Payer: BC Managed Care – PPO | Source: Ambulatory Visit | Attending: Infectious Diseases | Admitting: Infectious Diseases

## 2021-11-30 ENCOUNTER — Other Ambulatory Visit: Payer: Self-pay

## 2021-11-30 DIAGNOSIS — Z1231 Encounter for screening mammogram for malignant neoplasm of breast: Secondary | ICD-10-CM | POA: Diagnosis not present

## 2022-02-07 ENCOUNTER — Other Ambulatory Visit: Payer: Self-pay

## 2022-02-07 ENCOUNTER — Ambulatory Visit
Admission: RE | Admit: 2022-02-07 | Discharge: 2022-02-07 | Disposition: A | Discharge status: Home / Self Care | Payer: BC Managed Care – PPO | Source: Ambulatory Visit | Attending: Family Medicine | Admitting: Family Medicine

## 2022-02-07 VITALS — BP 139/73 | HR 88 | Temp 98.2°F | Resp 16

## 2022-02-07 DIAGNOSIS — M25512 Pain in left shoulder: Secondary | ICD-10-CM | POA: Diagnosis not present

## 2022-02-07 DIAGNOSIS — J019 Acute sinusitis, unspecified: Secondary | ICD-10-CM | POA: Diagnosis not present

## 2022-02-07 MED ORDER — DOXYCYCLINE HYCLATE 100 MG PO CAPS: 100.0000 mg | ORAL_CAPSULE | Freq: Two times a day (BID) | ORAL | 0 refills | Status: DC

## 2022-02-07 MED ORDER — DEXAMETHASONE SODIUM PHOSPHATE 10 MG/ML IJ SOLN
10.0000 mg | Freq: Once | INTRAMUSCULAR | Status: AC
  Administered 2022-02-07: 10 mg via INTRAMUSCULAR

## 2022-02-07 NOTE — ED Provider Notes (Signed)
Jessica Elliott    CSN: [id number] Arrival date & time: 02/07/22  1108      History   Chief Complaint Chief Complaint  Patient presents with   Otalgia   Cough   Sinus Pressure    HPI Jessica Elliott is a 66 y.o. female.   HPI Patient presents today for evaluation of sinus pressure R>L side, right ear pain which is now throbbing, and nonproductive cough. She is status post COVID-19 infection 3 weeks ago.  She reports all COVID symptoms completely resolved and current symptoms are new and present for over one week. She has taken OTC medication without relief of symptoms.  She denies fever, chest pain or chest tightness.  Patient also concerned of left shoulder pain that has been ongoing.  She has been seen by a orthopedic specialist a few months ago however the pain has persisted.  She reports pain with range of motion and pain while lying down.  She denies any loss of sensation.   Past Medical History:  Diagnosis Date   Allergic rhinitis, seasonal    Diabetes mellitus without complication ([place])    Hyperlipidemia    Hypertension    Polyp of colon    Sleep apnea     Patient Active Problem List   Diagnosis Date Noted   Type 2 diabetes mellitus with diabetic polyneuropathy, without long-term current use of insulin ([place]) 08/17/2021   Hyperlipidemia associated with type 2 diabetes mellitus ([place]) 08/17/2021   GAD (generalized anxiety disorder) 08/17/2021   Psychophysiological insomnia 08/17/2021   Morbid obesity ([place]) 08/17/2021   Claudication of both lower extremities ([place]) 06/02/2020   Hypertension associated with diabetes ([place])    Bilateral carpal tunnel syndrome 01/17/2018   History of colonic polyps    Benign neoplasm of transverse colon    Benign neoplasm of ascending colon    Elbow pain, chronic 10/10/2015   Myalgia 10/10/2015   Diabetic neuropathy ([place]) 07/11/2015   Dizziness 06/25/2015   Bilateral leg edema 06/25/2015   Absolute anemia 04/29/2015   DD  (diverticular disease) 04/29/2015   Backhand tennis elbow 04/29/2015   Hemorrhoid 04/29/2015   Indrawn nipple 04/29/2015   Arthritis of hand, degenerative 04/29/2015   ANA positive 04/29/2015   Avitaminosis D 04/29/2015   Herpes zona 04/29/2015   Asthma, exogenous 02/27/2009   Cervical pain 02/27/2009   Apnea, sleep 12/16/2008    Past Surgical History:  Procedure Laterality Date   ABDOMINAL HYSTERECTOMY  1995   still have 1 ovary   bone spur     removed from right shoulder   BREAST BIOPSY Right 12/05/2019   stereo bx/ x clip/  fibroadenoma   COLONOSCOPY WITH PROPOFOL N/A 11/22/2017   Procedure: COLONOSCOPY WITH PROPOFOL;  Surgeon: Lucilla Lame, MD;  Location[facility] ENDOSCOPY;  Service: Endoscopy;  Laterality: N/A;   SHOULDER ARTHROSCOPY     SHOULDER SURGERY Right 2007    OB History     Gravida  1   Para  1   Term      Preterm      AB      Living         SAB      IAB      Ectopic      Multiple      Live Births               Home Medications    Prior to Admission medications   Medication Sig Start Date End Date Taking? Authorizing  Provider  albuterol (PROVENTIL HFA;VENTOLIN HFA) 108 (90 Base) MCG/ACT inhaler Inhale 1-2 puffs into the lungs every 6 (six) hours as needed. 01/01/19   Bacigalupo, Dionne Bucy, MD  ALPRAZolam Duanne Moron) 0.5 MG tablet TAKE 1/2 TO 1 TABLET BY MOUTH 3 TIMES ASNEEDED FOR ANXIETY. 06/11/21   Chrismon, Vickki Muff, PA-C  aspirin 81 MG tablet Take 81 mg by mouth daily.    [provider]  benazepril (LOTENSIN) 10 MG tablet Take 1 tablet (10 mg total) by mouth daily. 09/01/21   Gwyneth Sprout, FNP  ezetimibe (ZETIA) 10 MG tablet Take 1 tablet (10 mg total) by mouth daily. 04/10/21   Virginia Crews, MD  fluticasone (FLONASE) 50 MCG/ACT nasal spray Place 2 sprays into both nostrils daily. Patient taking differently: Place 2 sprays into both nostrils as needed. 09/09/16   Mar Daring, PA-C  furosemide (LASIX) 20 MG tablet  TAKE 1 TABLET BY MOUTH DAILY. 06/30/21   Virginia Crews, MD  influenza vac split quadrivalent PF (FLUARIX QUADRIVALENT) 0.5 ML injection Inject into the muscle. 10/07/21   Carlyle Basques, MD  insulin aspart (NOVOLOG) 100 UNIT/ML injection Inject into the skin. 16-18-20 three times daily    [provider]  Insulin Glargine 300 UNIT/ML SOPN INJECT 70 UNITS EVERY NIGHT AS DIRECTED 11/16/17   [provider]  loratadine (CLARITIN) 10 MG tablet Take 1 tablet by mouth daily as needed. Reported on 01/02/2016 05/28/14   [provider]  Multiple Vitamin (MULTI-VITAMINS) TABS Take by mouth.    [provider]  [facility] VERIO test strip  07/26/15   [provider]  pantoprazole (PROTONIX) 40 MG tablet TAKE ONE TABLET BY MOUTH EVERY DAY 06/30/21   Bacigalupo, Dionne Bucy, MD  ULTICARE SHORT PEN NEEDLES 31G X 8 MM MISC USE AS DIRECTED FOUR TIMES DAILY 02/19/19   Mar Daring, PA-C  Vitamin D, Ergocalciferol, (DRISDOL) 1.25 MG (50000 UNIT) CAPS capsule TAKE 1 CAPSULE BY MOUTH EACH WEEK 08/26/21   Gwyneth Sprout, FNP    Family History Family History  Problem Relation Age of Onset   Emphysema Mother    Hyperlipidemia Mother    Diabetes Father    Aneurysm Father    Cancer Brother    Hyperlipidemia Brother    Pneumonia Brother    Diabetes Sister    Hypertension Sister    Diverticulitis Sister    Stroke Sister    Diabetes Sister    Hypertension Sister    Heart attack Sister    Diabetes Sister    Hypertension Sister    Cancer Sister    Heart disease Sister    Emphysema Sister    Diabetes Brother    Hyperlipidemia Brother    Hypertension Brother    Crohn's disease Brother    Breast cancer Maternal Aunt    Breast cancer Other     Social History Social History   Tobacco Use   Smoking status: Former   Smokeless tobacco: Never  Vaping Use   Vaping Use: Never used  Substance Use Topics   Alcohol use: Yes    Alcohol/week: 0.0 standard drinks     Comment: occasionally   Drug use: No     Allergies   Ibuprofen, Lisinopril, Statins, Codeine, and Penicillins   Review of Systems Review of Systems Pertinent negatives listed in HPI  Physical Exam Triage Vital Signs ED Triage Vitals  Enc Vitals Group     BP 02/07/22 1152 139/73     Pulse Rate  02/07/22 1152 88     Resp 02/07/22 1152 16     Temp 02/07/22 1152 98.2 F (36.8 C)     Temp Source 02/07/22 1152 Oral     SpO2 02/07/22 1152 97 %     Weight --      Height --      Head Circumference --      Peak Flow --      Pain Score 02/07/22 1151 5     Pain Loc --      Pain Edu? --      Excl. in [place]? --    No data found.  Updated Vital Signs BP 139/73 (BP Location: Right Arm)    Pulse 88    Temp 98.2 F (36.8 C) (Oral)    Resp 16    SpO2 97%   Visual Acuity Right Eye Distance:   Left Eye Distance:   Bilateral Distance:    Right Eye Near:   Left Eye Near:    Bilateral Near:     Physical Exam Constitutional:      Appearance: Normal appearance.  HENT:     Head: Normocephalic and atraumatic.     Nose: Mucosal edema, congestion and rhinorrhea present. Rhinorrhea is purulent.  Cardiovascular:     Rate and Rhythm: Normal rate and regular rhythm.     Heart sounds: Normal heart sounds.  Pulmonary:     Effort: Pulmonary effort is normal.     Breath sounds: Normal breath sounds and air entry.  Musculoskeletal:     Left shoulder: Tenderness present.     Cervical back: Normal range of motion and neck supple.     Comments: Left shoulder tenderness produced with range of motion movements.  No palpable shoulder or scapular tenderness  Lymphadenopathy:     Cervical: Cervical adenopathy present.  Psychiatric:        Behavior: Behavior is cooperative.     UC Treatments / Results  Labs (all labs ordered are listed, but only abnormal results are displayed) Labs Reviewed - No data to display  EKG   Radiology No results found.  Procedures Procedures (including  critical care time)  Medications Ordered in UC Medications - No data to display  Initial Impression / Assessment and Plan / UC Course  I have reviewed the triage vital signs and the nursing notes.  Pertinent labs & imaging results that were available during my care of the patient were reviewed by me and considered in my medical decision making (see chart for details).    Acute sinusitis  Treatment with doxycycline 100 mg twice daily for 10 days. Acute left shoulder pain patient given a Decadron IM injection here in clinic.  Patient is a diabetic therefore did not prescribe oral steroids. Advised patient to follow-up with recommended Ortho specialist as she may need an ultrasound opposed to the x-rays that she gotten previously to determine the source of pain. I Final Clinical Impressions(s) / UC Diagnoses   Final diagnoses:  Acute sinusitis, recurrence not specified, unspecified location  Left shoulder pain, unspecified chronicity   Discharge Instructions   None    ED Prescriptions     Medication Sig Dispense Auth. Provider   doxycycline (VIBRAMYCIN) 100 MG capsule Take 1 capsule (100 mg total) by mouth 2 (two) times daily. 20 capsule Scot Jun, FNP      PDMP not reviewed this encounter.   Scot Jun, FNP 02/07/22 1233

## 2022-02-07 NOTE — ED Triage Notes (Signed)
Pt presents with sinus pressure, bilateral ear pain and cough x 1 week. OTC medication is not helping. Pt had Covid 3 weeks ago.

## 2022-03-11 ENCOUNTER — Other Ambulatory Visit: Payer: Self-pay | Admitting: Family Medicine

## 2022-04-01 ENCOUNTER — Ambulatory Visit
Admission: RE | Admit: 2022-04-01 | Discharge: 2022-04-01 | Disposition: A | Discharge status: Home / Self Care | Payer: BC Managed Care – PPO | Source: Ambulatory Visit | Attending: Student | Admitting: Student

## 2022-04-01 VITALS — BP 136/66 | HR 94 | Temp 98.2°F | Resp 18

## 2022-04-01 DIAGNOSIS — B349 Viral infection, unspecified: Secondary | ICD-10-CM | POA: Diagnosis not present

## 2022-04-01 DIAGNOSIS — J4521 Mild intermittent asthma with (acute) exacerbation: Secondary | ICD-10-CM

## 2022-04-01 MED ORDER — ALBUTEROL SULFATE HFA 108 (90 BASE) MCG/ACT IN AERS: 1.0000 | INHALATION_SPRAY | Freq: Four times a day (QID) | RESPIRATORY_TRACT | 0 refills | Status: AC | PRN

## 2022-04-01 MED ORDER — BENZONATATE 100 MG PO CAPS: 100.0000 mg | ORAL_CAPSULE | Freq: Three times a day (TID) | ORAL | 0 refills | Status: DC

## 2022-04-01 NOTE — ED Triage Notes (Signed)
Pt presents with congestion, cough, chest congestion and ST x 3 days ?

## 2022-04-01 NOTE — ED Provider Notes (Signed)
Renaldo Fiddler    CSN: [id number] Arrival date & time: 04/01/22  1135      History   Chief Complaint Chief Complaint  Patient presents with   Sore Throat   Nasal Congestion   Fever    HPI Jessica Elliott is a 66 y.o. female presenting with viral syndrome for 3 days.  History diabetes.  Describes sore throat, cough, congestion, fevers as high as 101 last night.  States cough is nonproductive, and there is no associated shortness of breath, chest pain, dizziness, weakness.  The nasal congestion is watery, and there is no associated facial pain or ear pain.  Has been using sinus rinses at home with some relief.  Also attempted Alka-Seltzer.  States the cough is keeping her up at night.  States that she does not have a diagnosis of pulmonary disease, but often requires albuterol during viral syndromes.  HPI  Past Medical History:  Diagnosis Date   Allergic rhinitis, seasonal    Diabetes mellitus without complication ([facility])    Hyperlipidemia    Hypertension    Polyp of colon    Sleep apnea     Patient Active Problem List   Diagnosis Date Noted   Type 2 diabetes mellitus with diabetic polyneuropathy, without long-term current use of insulin ([facility]) 08/17/2021   Hyperlipidemia associated with type 2 diabetes mellitus ([facility]) 08/17/2021   GAD (generalized anxiety disorder) 08/17/2021   Psychophysiological insomnia 08/17/2021   Morbid obesity ([facility]) 08/17/2021   Claudication of both lower extremities ([facility]) 06/02/2020   Hypertension associated with diabetes ([facility])    Bilateral carpal tunnel syndrome 01/17/2018   History of colonic polyps    Benign neoplasm of transverse colon    Benign neoplasm of ascending colon    Elbow pain, chronic 10/10/2015   Myalgia 10/10/2015   Diabetic neuropathy ([facility]) 07/11/2015   Dizziness 06/25/2015   Bilateral leg edema 06/25/2015   Absolute anemia 04/29/2015   DD (diverticular disease) 04/29/2015   Backhand tennis elbow 04/29/2015    Hemorrhoid 04/29/2015   Indrawn nipple 04/29/2015   Arthritis of hand, degenerative 04/29/2015   ANA positive 04/29/2015   Avitaminosis D 04/29/2015   Herpes zona 04/29/2015   Asthma, exogenous 02/27/2009   Cervical pain 02/27/2009   Apnea, sleep 12/16/2008    Past Surgical History:  Procedure Laterality Date   ABDOMINAL HYSTERECTOMY  1995   still have 1 ovary   bone spur     removed from right shoulder   BREAST BIOPSY Right 12/05/2019   stereo bx/ x clip/  fibroadenoma   COLONOSCOPY WITH PROPOFOL N/A 11/22/2017   Procedure: COLONOSCOPY WITH PROPOFOL;  Surgeon: Midge Minium, MD;  Location: [facility] ENDOSCOPY;  Service: Endoscopy;  Laterality: N/A;   SHOULDER ARTHROSCOPY     SHOULDER SURGERY Right 2007    OB History     Gravida  1   Para  1   Term      Preterm      AB      Living         SAB      IAB      Ectopic      Multiple      Live Births               Home Medications    Prior to Admission medications   Medication Sig Start Date End Date Taking? Authorizing Provider  albuterol (VENTOLIN HFA) 108 (90 Base) MCG/ACT inhaler Inhale 1-2 puffs into the lungs every  6 (six) hours as needed for wheezing or shortness of breath. 04/01/22  Yes Rhys Martini, PA-C  benzonatate (TESSALON) 100 MG capsule Take 1 capsule (100 mg total) by mouth every 8 (eight) hours. 04/01/22  Yes Rhys Martini, PA-C  ALPRAZolam Prudy Feeler) 0.5 MG tablet TAKE 1/2 TO 1 TABLET BY MOUTH 3 TIMES ASNEEDED FOR ANXIETY. 06/11/21   Chrismon, Jodell Cipro, PA-C  aspirin 81 MG tablet Take 81 mg by mouth daily.    [provider]  benazepril (LOTENSIN) 10 MG tablet Take 1 tablet (10 mg total) by mouth daily. 09/01/21   Jacky Kindle, FNP  doxycycline (VIBRAMYCIN) 100 MG capsule Take 1 capsule (100 mg total) by mouth 2 (two) times daily. 02/07/22   Bing Neighbors, FNP  ezetimibe (ZETIA) 10 MG tablet Take 1 tablet (10 mg total) by mouth daily. 04/10/21   Erasmo Downer, MD  fluticasone  (FLONASE) 50 MCG/ACT nasal spray Place 2 sprays into both nostrils daily. Patient taking differently: Place 2 sprays into both nostrils as needed. 09/09/16   Margaretann Loveless, PA-C  furosemide (LASIX) 20 MG tablet TAKE 1 TABLET BY MOUTH DAILY. 06/30/21   Erasmo Downer, MD  influenza vac split quadrivalent PF (FLUARIX QUADRIVALENT) 0.5 ML injection Inject into the muscle. 10/07/21   Judyann Munson, MD  insulin aspart (NOVOLOG) 100 UNIT/ML injection Inject into the skin. 16-18-20 three times daily    [provider]  Insulin Glargine 300 UNIT/ML SOPN INJECT 70 UNITS EVERY NIGHT AS DIRECTED 11/16/17   [provider]  loratadine (CLARITIN) 10 MG tablet Take 1 tablet by mouth daily as needed. Reported on 01/02/2016 05/28/14   [provider]  Multiple Vitamin (MULTI-VITAMINS) TABS Take by mouth.    [provider]  [facility] VERIO test strip  07/26/15   [provider]  pantoprazole (PROTONIX) 40 MG tablet TAKE ONE TABLET BY MOUTH EVERY DAY 06/30/21   Bacigalupo, Marzella Schlein, MD  ULTICARE SHORT PEN NEEDLES 31G X 8 MM MISC USE AS DIRECTED FOUR TIMES DAILY 02/19/19   Margaretann Loveless, PA-C  Vitamin D, Ergocalciferol, (DRISDOL) 1.25 MG (50000 UNIT) CAPS capsule TAKE 1 CAPSULE BY MOUTH EACH WEEK 08/26/21   Jacky Kindle, FNP    Family History Family History  Problem Relation Age of Onset   Emphysema Mother    Hyperlipidemia Mother    Diabetes Father    Aneurysm Father    Cancer Brother    Hyperlipidemia Brother    Pneumonia Brother    Diabetes Sister    Hypertension Sister    Diverticulitis Sister    Stroke Sister    Diabetes Sister    Hypertension Sister    Heart attack Sister    Diabetes Sister    Hypertension Sister    Cancer Sister    Heart disease Sister    Emphysema Sister    Diabetes Brother    Hyperlipidemia Brother    Hypertension Brother    Crohn's disease Brother    Breast cancer Maternal Aunt    Breast cancer Other      Social History Social History   Tobacco Use   Smoking status: Former   Smokeless tobacco: Never  Vaping Use   Vaping Use: Never used  Substance Use Topics   Alcohol use: Yes    Alcohol/week: 0.0 standard drinks    Comment: occasionally   Drug use: No     Allergies   Ibuprofen, Lisinopril, Statins, Codeine, and Penicillins  Review of Systems Review of Systems  Constitutional:  Negative for appetite change, chills and fever.  HENT:  Positive for congestion. Negative for ear pain, rhinorrhea, sinus pressure, sinus pain and sore throat.   Eyes:  Negative for redness and visual disturbance.  Respiratory:  Positive for cough. Negative for chest tightness, shortness of breath and wheezing.   Cardiovascular:  Negative for chest pain and palpitations.  Gastrointestinal:  Negative for abdominal pain, constipation, diarrhea, nausea and vomiting.  Genitourinary:  Negative for dysuria, frequency and urgency.  Musculoskeletal:  Negative for myalgias.  Neurological:  Negative for dizziness, weakness and headaches.  Psychiatric/Behavioral:  Negative for confusion.   All other systems reviewed and are negative.   Physical Exam Triage Vital Signs ED Triage Vitals  Enc Vitals Group     BP 04/01/22 1220 136/66     Pulse Rate 04/01/22 1220 94     Resp 04/01/22 1220 18     Temp 04/01/22 1220 98.2 F (36.8 C)     Temp src --      SpO2 04/01/22 1220 97 %     Weight --      Height --      Head Circumference --      Peak Flow --      Pain Score 04/01/22 1225 0     Pain Loc --      Pain Edu? --      Excl. in [place]? --    No data found.  Updated Vital Signs BP 136/66   Pulse 94   Temp 98.2 F (36.8 C)   Resp 18   SpO2 97%   Visual Acuity Right Eye Distance:   Left Eye Distance:   Bilateral Distance:    Right Eye Near:   Left Eye Near:    Bilateral Near:     Physical Exam Vitals reviewed.  Constitutional:      General: She is not in acute distress.    Appearance:  Normal appearance. She is not ill-appearing.  HENT:     Head: Normocephalic and atraumatic.     Right Ear: Tympanic membrane, ear canal and external ear normal. No tenderness. No middle ear effusion. There is no impacted cerumen. Tympanic membrane is not perforated, erythematous, retracted or bulging.     Left Ear: Tympanic membrane, ear canal and external ear normal. No tenderness.  No middle ear effusion. There is no impacted cerumen. Tympanic membrane is not perforated, erythematous, retracted or bulging.     Nose: Nose normal. No congestion.     Mouth/Throat:     Mouth: Mucous membranes are moist.     Pharynx: Uvula midline. No oropharyngeal exudate or posterior oropharyngeal erythema.  Eyes:     Extraocular Movements: Extraocular movements intact.     Pupils: Pupils are equal, round, and reactive to light.  Cardiovascular:     Rate and Rhythm: Normal rate and regular rhythm.     Heart sounds: Normal heart sounds.  Pulmonary:     Effort: Pulmonary effort is normal.     Breath sounds: Normal breath sounds. No decreased breath sounds, wheezing, rhonchi or rales.  Abdominal:     Palpations: Abdomen is soft.     Tenderness: There is no abdominal tenderness. There is no guarding or rebound.  Lymphadenopathy:     Cervical: No cervical adenopathy.     Right cervical: No superficial cervical adenopathy.    Left cervical: No superficial cervical adenopathy.  Neurological:     General: No  focal deficit present.     Mental Status: She is alert and oriented to person, place, and time.  Psychiatric:        Mood and Affect: Mood normal.        Behavior: Behavior normal.        Thought Content: Thought content normal.        Judgment: Judgment normal.     UC Treatments / Results  Labs (all labs ordered are listed, but only abnormal results are displayed) Labs Reviewed - No data to display  EKG   Radiology No results found.  Procedures Procedures (including critical care  time)  Medications Ordered in UC Medications - No data to display  Initial Impression / Assessment and Plan / UC Course  I have reviewed the triage vital signs and the nursing notes.  Pertinent labs & imaging results that were available during my care of the patient were reviewed by me and considered in my medical decision making (see chart for details).     This patient is a very pleasant 66 y.o. year old female presenting with viral syndrome x3 days. Today this pt is afebrile nontachycardic nontachypneic, oxygenating well on room air, no wheezes rhonchi or rales. Nontoxic appearing.  Clinically, appears to have a mild viral syndrome.  Will manage with Tessalon and albuterol for cough.  I do suspect there is a reactive airway component, as she often requires albuterol inhaler during viral syndromes.  There are no adventitious breath sounds; low concern for pneumonia, pneumothorax, PE, etc.  ED return precautions discussed. Patient verbalizes understanding and agreement.   -Coding Level 4 for acute exacerbation of chronic condition and prescription drug management.   Final Clinical Impressions(s) / UC Diagnoses   Final diagnoses:  Viral syndrome  Mild intermittent reactive airway disease with acute exacerbation     Discharge Instructions      -Albuterol inhaler as needed for cough, wheezing, shortness of breath, 1 to 2 puffs every 6 hours as needed. -Tessalon (Benzonatate) as needed for cough. Take one pill up to 3x daily (every 8 hours) -Continue over-the-counter medications if they're helping -Follow-up if symptoms worsen  - new shortness of breath, new chest pain, new dizziness, coughing up red or brown sputum.    ED Prescriptions     Medication Sig Dispense Auth. Provider   albuterol (VENTOLIN HFA) 108 (90 Base) MCG/ACT inhaler Inhale 1-2 puffs into the lungs every 6 (six) hours as needed for wheezing or shortness of breath. 1 each Rhys Martini, PA-C   benzonatate  (TESSALON) 100 MG capsule Take 1 capsule (100 mg total) by mouth every 8 (eight) hours. 21 capsule Rhys Martini, PA-C      PDMP not reviewed this encounter.   Rhys Martini, PA-C 04/01/22 1310

## 2022-04-01 NOTE — Discharge Instructions (Addendum)
-  Albuterol inhaler as needed for cough, wheezing, shortness of breath, 1 to 2 puffs every 6 hours as needed. ?-Tessalon (Benzonatate) as needed for cough. Take one pill up to 3x daily (every 8 hours) ?-Continue over-the-counter medications if they're helping ?-Follow-up if symptoms worsen  - new shortness of breath, new chest pain, new dizziness, coughing up red or brown sputum.  ?

## 2022-04-15 ENCOUNTER — Other Ambulatory Visit: Payer: Self-pay | Admitting: Family Medicine

## 2022-04-15 DIAGNOSIS — R600 Localized edema: Secondary | ICD-10-CM

## 2022-06-29 ENCOUNTER — Other Ambulatory Visit: Payer: Self-pay | Admitting: Family Medicine

## 2022-06-29 DIAGNOSIS — I10 Essential (primary) hypertension: Secondary | ICD-10-CM

## 2022-07-11 ENCOUNTER — Ambulatory Visit
Admission: EM | Admit: 2022-07-11 | Discharge: 2022-07-11 | Disposition: A | Discharge status: Home / Self Care | Payer: BC Managed Care – PPO

## 2022-07-11 ENCOUNTER — Encounter: Payer: Self-pay | Admitting: Emergency Medicine

## 2022-07-11 ENCOUNTER — Ambulatory Visit (INDEPENDENT_AMBULATORY_CARE_PROVIDER_SITE_OTHER): Payer: BC Managed Care – PPO

## 2022-07-11 DIAGNOSIS — R051 Acute cough: Secondary | ICD-10-CM | POA: Diagnosis not present

## 2022-07-11 DIAGNOSIS — J209 Acute bronchitis, unspecified: Secondary | ICD-10-CM

## 2022-07-11 MED ORDER — BENZONATATE 100 MG PO CAPS: 200.0000 mg | ORAL_CAPSULE | Freq: Three times a day (TID) | ORAL | 0 refills | Status: DC | PRN

## 2022-07-11 MED ORDER — PROMETHAZINE-DM 6.25-15 MG/5ML PO SYRP: 5.0000 mL | ORAL_SOLUTION | Freq: Four times a day (QID) | ORAL | 0 refills | Status: DC | PRN

## 2022-07-11 NOTE — ED Provider Notes (Signed)
[name]    CSN: [id number] Arrival date & time: 07/11/22  1304      History   Chief Complaint Chief Complaint  Patient presents with   Cough    HPI Psalms Olarte is a 66 y.o. female.   HPI  66 year old female here for evaluation of respiratory complaint.  Patient reports that she has been experiencing a cough for the last 3 weeks.  She was seen at a local clinic on 06/28/2022 and diagnosed with bronchitis.  At that time she was treated with a course of doxycycline, Medrol Dosepak, and albuterol inhaler.  She states that she completed her medication but is still continuing to cough.  The cough is worse when she lays down at night.  She will intermittently have sputum production and states that last night it was a thick tan sputum that she was eventually able to get up.  She has had some intermittent wheezing as well as runny nose and nasal congestion.  She denies any shortness of breath.  Concerned that she may have developed pneumonia and is here requesting chest x-ray.  Patient does endorse that the left side of her back hurts due to the amount of coughing she is sustained.  Past Medical History:  Diagnosis Date   Allergic rhinitis, seasonal    Diabetes mellitus without complication ([place])    Hyperlipidemia    Hypertension    Polyp of colon    Sleep apnea     Patient Active Problem List   Diagnosis Date Noted   Type 2 diabetes mellitus with diabetic polyneuropathy, without long-term current use of insulin ([place]) 08/17/2021   Hyperlipidemia associated with type 2 diabetes mellitus ([place]) 08/17/2021   GAD (generalized anxiety disorder) 08/17/2021   Psychophysiological insomnia 08/17/2021   Morbid obesity ([place]) 08/17/2021   Claudication of both lower extremities ([place]) 06/02/2020   Hypertension associated with diabetes ([place])    Bilateral carpal tunnel syndrome 01/17/2018   History of colonic polyps    Benign neoplasm of transverse colon    Benign neoplasm of  ascending colon    Elbow pain, chronic 10/10/2015   Myalgia 10/10/2015   Diabetic neuropathy ([place]) 07/11/2015   Dizziness 06/25/2015   Bilateral leg edema 06/25/2015   Absolute anemia 04/29/2015   DD (diverticular disease) 04/29/2015   Backhand tennis elbow 04/29/2015   Hemorrhoid 04/29/2015   Indrawn nipple 04/29/2015   Arthritis of hand, degenerative 04/29/2015   ANA positive 04/29/2015   Avitaminosis D 04/29/2015   Herpes zona 04/29/2015   Asthma, exogenous 02/27/2009   Cervical pain 02/27/2009   Apnea, sleep 12/16/2008    Past Surgical History:  Procedure Laterality Date   ABDOMINAL HYSTERECTOMY  1995   still have 1 ovary   bone spur     removed from right shoulder   BREAST BIOPSY Right 12/05/2019   stereo bx/ x clip/  fibroadenoma   COLONOSCOPY WITH PROPOFOL N/A 11/22/2017   Procedure: COLONOSCOPY WITH PROPOFOL;  Surgeon: Lucilla Lame, MD;  Location: [facility] ENDOSCOPY;  Service: Endoscopy;  Laterality: N/A;   SHOULDER ARTHROSCOPY     SHOULDER SURGERY Right 2007    OB History     Gravida  1   Para  1   Term      Preterm      AB      Living         SAB      IAB      Ectopic      Multiple  Live Births               Home Medications    Prior to Admission medications   Medication Sig Start Date End Date Taking? Authorizing Provider  benazepril (LOTENSIN) 10 MG tablet Take 1 tablet by mouth daily. 06/29/22  Yes [provider]  benzonatate (TESSALON) 100 MG capsule Take 2 capsules (200 mg total) by mouth 3 (three) times daily as needed for cough. 07/11/22  Yes Margarette Canada, NP  promethazine-dextromethorphan (PROMETHAZINE-DM) 6.25-15 MG/5ML syrup Take 5 mLs by mouth 4 (four) times daily as needed. 07/11/22  Yes Margarette Canada, NP  albuterol (VENTOLIN HFA) 108 (90 Base) MCG/ACT inhaler Inhale 1-2 puffs into the lungs every 6 (six) hours as needed for wheezing or shortness of breath. 04/01/22   Hazel Sams, PA-C  ALPRAZolam Duanne Moron) 0.5 MG  tablet TAKE 1/2 TO 1 TABLET BY MOUTH 3 TIMES ASNEEDED FOR ANXIETY. 06/11/21   Chrismon, Vickki Muff, PA-C  aspirin 81 MG tablet Take 81 mg by mouth daily.    [provider]  benazepril (LOTENSIN) 10 MG tablet Take 1 tablet (10 mg total) by mouth daily. 09/01/21   Gwyneth Sprout, FNP  ezetimibe (ZETIA) 10 MG tablet Take 1 tablet (10 mg total) by mouth daily. 04/10/21   Virginia Crews, MD  fluticasone (FLONASE) 50 MCG/ACT nasal spray Place 2 sprays into both nostrils daily. Patient taking differently: Place 2 sprays into both nostrils as needed. 09/09/16   Mar Daring, PA-C  furosemide (LASIX) 20 MG tablet Take 1 tablet (20 mg total) by mouth daily. Please schedule office visit before any future refill. 04/15/22   Gwyneth Sprout, FNP  influenza vac split quadrivalent PF (FLUARIX QUADRIVALENT) 0.5 ML injection Inject into the muscle. 10/07/21   Carlyle Basques, MD  insulin aspart (NOVOLOG) 100 UNIT/ML injection Inject into the skin. 16-18-20 three times daily    [provider]  Insulin Glargine 300 UNIT/ML SOPN INJECT 70 UNITS EVERY NIGHT AS DIRECTED 11/16/17   [provider]  loratadine (CLARITIN) 10 MG tablet Take 1 tablet by mouth daily as needed. Reported on 01/02/2016 05/28/14   [provider]  Multiple Vitamin (MULTI-VITAMINS) TABS Take by mouth.    [provider]  [facility] VERIO test strip  07/26/15   [provider]  pantoprazole (PROTONIX) 40 MG tablet TAKE ONE TABLET BY MOUTH EVERY DAY 06/30/21   Bacigalupo, Dionne Bucy, MD  ULTICARE SHORT PEN NEEDLES 31G X 8 MM MISC USE AS DIRECTED FOUR TIMES DAILY 02/19/19   Mar Daring, PA-C  Vitamin D, Ergocalciferol, (DRISDOL) 1.25 MG (50000 UNIT) CAPS capsule TAKE 1 CAPSULE BY MOUTH EACH WEEK 08/26/21   Gwyneth Sprout, FNP    Family History Family History  Problem Relation Age of Onset   Emphysema Mother    Hyperlipidemia Mother    Diabetes Father    Aneurysm Father    Cancer Brother     Hyperlipidemia Brother    Pneumonia Brother    Diabetes Sister    Hypertension Sister    Diverticulitis Sister    Stroke Sister    Diabetes Sister    Hypertension Sister    Heart attack Sister    Diabetes Sister    Hypertension Sister    Cancer Sister    Heart disease Sister    Emphysema Sister    Diabetes Brother    Hyperlipidemia Brother    Hypertension Brother    Crohn's disease Brother  Breast cancer Maternal Aunt    Breast cancer Other     Social History Social History   Tobacco Use   Smoking status: Former   Smokeless tobacco: Never  Vaping Use   Vaping Use: Never used  Substance Use Topics   Alcohol use: Yes    Alcohol/week: 0.0 standard drinks of alcohol    Comment: occasionally   Drug use: No     Allergies   Ibuprofen, Lisinopril, Statins, Codeine, and Penicillins   Review of Systems Review of Systems  Constitutional:  Negative for fever.  HENT:  Positive for congestion and rhinorrhea.   Respiratory:  Positive for cough and wheezing. Negative for shortness of breath.      Physical Exam Triage Vital Signs ED Triage Vitals [07/11/22 1319]  Enc Vitals Group     BP 136/78     Pulse Rate 88     Resp 19     Temp 98.1 F (36.7 C)     Temp Source Oral     SpO2 96 %     Weight      Height      Head Circumference      Peak Flow      Pain Score 6     Pain Loc      Pain Edu?      Excl. in [place]?    No data found.  Updated Vital Signs BP 136/78 (BP Location: Left Arm)   Pulse 88   Temp 98.1 F (36.7 C) (Oral)   Resp 19   SpO2 96%   Visual Acuity Right Eye Distance:   Left Eye Distance:   Bilateral Distance:    Right Eye Near:   Left Eye Near:    Bilateral Near:     Physical Exam Vitals and nursing note reviewed.  Constitutional:      Appearance: Normal appearance. She is not ill-appearing.  HENT:     Head: Normocephalic and atraumatic.     Right Ear: Tympanic membrane, ear canal and external ear normal. There is no impacted  cerumen.     Left Ear: Tympanic membrane, ear canal and external ear normal. There is no impacted cerumen.     Nose: Nose normal. No congestion or rhinorrhea.     Mouth/Throat:     Mouth: Mucous membranes are moist.     Pharynx: Oropharynx is clear. No oropharyngeal exudate or posterior oropharyngeal erythema.  Cardiovascular:     Rate and Rhythm: Normal rate and regular rhythm.     Pulses: Normal pulses.     Heart sounds: Normal heart sounds. No murmur heard.    No friction rub. No gallop.  Pulmonary:     Effort: Pulmonary effort is normal.     Breath sounds: Normal breath sounds. No wheezing, rhonchi or rales.  Musculoskeletal:     Cervical back: Normal range of motion and neck supple.  Lymphadenopathy:     Cervical: No cervical adenopathy.  Skin:    General: Skin is warm and dry.     Capillary Refill: Capillary refill takes less than 2 seconds.     Findings: No erythema or rash.  Neurological:     General: No focal deficit present.     Mental Status: She is alert and oriented to person, place, and time.  Psychiatric:        Mood and Affect: Mood normal.        Behavior: Behavior normal.        Thought Content: Thought  content normal.        Judgment: Judgment normal.      UC Treatments / Results  Labs (all labs ordered are listed, but only abnormal results are displayed) Labs Reviewed - No data to display  EKG   Radiology DG Chest 2 View  Result Date: 07/11/2022 CLINICAL DATA:  Cough for 3 weeks. EXAM: CHEST - 2 VIEW COMPARISON:  05/28/2014 FINDINGS: Heart size is normal. Aortic atherosclerosis. No pleural effusion or interstitial edema. No airspace opacities identified. Visualized osseous structures appear intact. IMPRESSION: No active cardiopulmonary abnormalities. Electronically Signed   By: Kerby Moors M.D.   On: 07/11/2022 13:49    Procedures Procedures (including critical care time)  Medications Ordered in UC Medications - No data to display  Initial  Impression / Assessment and Plan / UC Course  I have reviewed the triage vital signs and the nursing notes.  Pertinent labs & imaging results that were available during my care of the patient were reviewed by me and considered in my medical decision making (see chart for details).  Patient is a very pleasant, nontoxic-appearing 66 year old female here for evaluation of continuing cough x3 weeks as outlined in HPI above.  She does endorse some upper respiratory symptoms as well.  Patient's physical exam reveals pearly-gray tympanic membranes bilaterally with normal light reflex and clear external auditory canals.  Nasal mucosa is pink and moist without erythema, edema, or discharge.  Oropharyngeal exam is benign.  No cervical lymphadenopathy appreciable exam.  Cardiopulmonary exam reveals S1-S2 heart sounds with regular rate and rhythm and lung sounds that are clear to auscultation in all fields.  Patient does have some hoarseness in her voice which is most likely secondary from coughing.  I will obtain chest x-ray to look for the presence of pneumonia Patient request.  Chest x-ray independently reviewed and evaluated by me.  Impression: No evidence of pneumonia or effusion.  Radiology overread is pending. Radiology read states no active cardiopulmonary disease.  I will discharge patient home with diagnosis of bronchitis.  I will prescribe her Tessalon Perles and Promethazine DM cough syrup to help with cough during the day and at nighttime.  She has an inhaler that she can use as needed for shortness of breath or wheezing.   Final Clinical Impressions(s) / UC Diagnoses   Final diagnoses:  Acute cough  Acute bronchitis, unspecified organism     Discharge Instructions      Your chest x-ray did not show any evidence of pneumonia.  I believe your cough is still result of the inflammation caused by the bronchitis.  Use the Tessalon Perles every 8 hours during the day as needed for cough.  Use  your albuterol inhaler, 2 puffs every 4-6 hours, as needed for shortness of breath or wheezing.  Use the Promethazine DM cough syrup at bedtime.  This will help with drying of excess mucus and also will make you drowsy.  This should help you get sleep so your body can rest and heal.  If you develop any new or worsening symptoms please return for reevaluation or see your primary care provider.     ED Prescriptions     Medication Sig Dispense Auth. Provider   benzonatate (TESSALON) 100 MG capsule Take 2 capsules (200 mg total) by mouth 3 (three) times daily as needed for cough. 21 capsule Margarette Canada, NP   promethazine-dextromethorphan (PROMETHAZINE-DM) 6.25-15 MG/5ML syrup Take 5 mLs by mouth 4 (four) times daily as needed. 118 mL Margarette Canada,  NP      PDMP not reviewed this encounter.   Margarette Canada, NP 07/11/22 1357

## 2022-07-11 NOTE — Discharge Instructions (Addendum)
Your chest x-ray did not show any evidence of pneumonia.  I believe your cough is still result of the inflammation caused by the bronchitis.  Use the Tessalon Perles every 8 hours during the day as needed for cough.  Use your albuterol inhaler, 2 puffs every 4-6 hours, as needed for shortness of breath or wheezing.  Use the Promethazine DM cough syrup at bedtime.  This will help with drying of excess mucus and also will make you drowsy.  This should help you get sleep so your body can rest and heal.  If you develop any new or worsening symptoms please return for reevaluation or see your primary care provider.

## 2022-07-11 NOTE — ED Triage Notes (Signed)
Pt reports having cough for 3 weeks. Was given antibiotics and steroid for it but still persistent. Reports now coughing up off white, chunking looking mucous. Pain on left mid back with coughing. Pt wanting xray to make sure doesn't have PNA

## 2022-09-28 ENCOUNTER — Other Ambulatory Visit (HOSPITAL_BASED_OUTPATIENT_CLINIC_OR_DEPARTMENT_OTHER): Payer: Self-pay

## 2022-09-28 MED ORDER — FLUARIX QUADRIVALENT 0.5 ML IM SUSY
PREFILLED_SYRINGE | INTRAMUSCULAR | 0 refills | Status: DC
  Filled 2022-09-28: qty 0.5, 1d supply, fill #0

## 2022-10-18 ENCOUNTER — Encounter (INDEPENDENT_AMBULATORY_CARE_PROVIDER_SITE_OTHER): Payer: Self-pay

## 2022-10-21 ENCOUNTER — Other Ambulatory Visit: Payer: Self-pay | Admitting: *Deleted

## 2022-10-21 ENCOUNTER — Telehealth: Payer: Self-pay | Admitting: *Deleted

## 2022-10-21 DIAGNOSIS — Z860100 Personal history of colon polyps, unspecified: Secondary | ICD-10-CM

## 2022-10-21 MED ORDER — PEG 3350-KCL-NABCB-NACL-NASULF 236 G PO SOLR: 4000.0000 mL | Freq: Once | ORAL | 0 refills | Status: AC

## 2022-10-21 NOTE — Telephone Encounter (Signed)
Gastroenterology Pre-Procedure Review  Request Date: 12/07/2022 Requesting Physician: Dr. Allen Norris  PATIENT REVIEW QUESTIONS: The patient responded to the following health history questions as indicated:    1. Are you having any GI issues? no 2. Do you have a personal history of Polyps? yes (last colonoscopy 11/26/2017) 3. Do you have a family history of Colon Cancer or Polyps? yes (family history of cancer) 4. Diabetes Mellitus? yes (Type 2 DM, taking metformin and insulin) 5. Joint replacements in the past 12 months?no 6. Major health problems in the past 3 months?no 7. Any artificial heart valves, MVP, or defibrillator?no    MEDICATIONS & ALLERGIES:    Patient reports the following regarding taking any anticoagulation/antiplatelet therapy:   Plavix, Coumadin, Eliquis, Xarelto, Lovenox, Pradaxa, Brilinta, or Effient? no Aspirin? yes(81 mg)  Patient confirms/reports the following medications:  Current Outpatient Medications  Medication Sig Dispense Refill   albuterol (VENTOLIN HFA) 108 (90 Base) MCG/ACT inhaler Inhale 1-2 puffs into the lungs every 6 (six) hours as needed for wheezing or shortness of breath. 1 each 0   ALPRAZolam (XANAX) 0.5 MG tablet TAKE 1/2 TO 1 TABLET BY MOUTH 3 TIMES ASNEEDED FOR ANXIETY. 90 tablet 0   aspirin 81 MG tablet Take 81 mg by mouth daily.     benazepril (LOTENSIN) 10 MG tablet Take 1 tablet (10 mg total) by mouth daily. 90 tablet 3   benazepril (LOTENSIN) 10 MG tablet Take 1 tablet by mouth daily.     benzonatate (TESSALON) 100 MG capsule Take 2 capsules (200 mg total) by mouth 3 (three) times daily as needed for cough. 21 capsule 0   ezetimibe (ZETIA) 10 MG tablet Take 1 tablet (10 mg total) by mouth daily. 90 tablet 1   fluticasone (FLONASE) 50 MCG/ACT nasal spray Place 2 sprays into both nostrils daily. (Patient taking differently: Place 2 sprays into both nostrils as needed.) 16 g 6   furosemide (LASIX) 20 MG tablet Take 1 tablet (20 mg total) by mouth  daily. Please schedule office visit before any future refill. 90 tablet 0   influenza vac split quadrivalent PF (FLUARIX QUADRIVALENT) 0.5 ML injection Inject into the muscle. 0.5 mL 0   influenza vac split quadrivalent PF (FLUARIX QUADRIVALENT) 0.5 ML injection Inject into the muscle. 0.5 mL 0   insulin aspart (NOVOLOG) 100 UNIT/ML injection Inject into the skin. 16-18-20 three times daily     Insulin Glargine 300 UNIT/ML SOPN INJECT 70 UNITS EVERY NIGHT AS DIRECTED     loratadine (CLARITIN) 10 MG tablet Take 1 tablet by mouth daily as needed. Reported on 01/02/2016     Multiple Vitamin (MULTI-VITAMINS) TABS Take by mouth.     ONETOUCH VERIO test strip   10   pantoprazole (PROTONIX) 40 MG tablet TAKE ONE TABLET BY MOUTH EVERY DAY 30 tablet 3   promethazine-dextromethorphan (PROMETHAZINE-DM) 6.25-15 MG/5ML syrup Take 5 mLs by mouth 4 (four) times daily as needed. 118 mL 0   ULTICARE SHORT PEN NEEDLES 31G X 8 MM MISC USE AS DIRECTED FOUR TIMES DAILY 200 each 5   Vitamin D, Ergocalciferol, (DRISDOL) 1.25 MG (50000 UNIT) CAPS capsule TAKE 1 CAPSULE BY MOUTH EACH WEEK 4 capsule 5   No current facility-administered medications for this visit.    Patient confirms/reports the following allergies:  Allergies  Allergen Reactions   Ibuprofen Other (See Comments)    Drowsy   Lisinopril Other (See Comments)    hematuria   Statins Other (See Comments)   Codeine Hives  Penicillins Hives    No orders of the defined types were placed in this encounter.   AUTHORIZATION INFORMATION Primary Insurance: 1D#: Group #:  Secondary Insurance: 1D#: Group #:  SCHEDULE INFORMATION: Date: 12/07/2022 Time: Location: [facility]

## 2022-11-22 ENCOUNTER — Other Ambulatory Visit: Payer: Self-pay | Admitting: Infectious Diseases

## 2022-11-22 DIAGNOSIS — Z1231 Encounter for screening mammogram for malignant neoplasm of breast: Secondary | ICD-10-CM

## 2022-12-02 ENCOUNTER — Ambulatory Visit
Admission: RE | Admit: 2022-12-02 | Discharge: 2022-12-02 | Disposition: A | Discharge status: Home / Self Care | Payer: BC Managed Care – PPO | Source: Ambulatory Visit | Attending: Infectious Diseases | Admitting: Infectious Diseases

## 2022-12-02 DIAGNOSIS — Z1231 Encounter for screening mammogram for malignant neoplasm of breast: Secondary | ICD-10-CM | POA: Insufficient documentation

## 2022-12-07 ENCOUNTER — Ambulatory Visit: Payer: BC Managed Care – PPO | Admitting: Anesthesiology

## 2022-12-07 ENCOUNTER — Ambulatory Visit
Admission: RE | Admit: 2022-12-07 | Discharge: 2022-12-07 | Disposition: A | Discharge status: Home / Self Care | Payer: BC Managed Care – PPO | Source: Ambulatory Visit | Attending: Gastroenterology | Admitting: Gastroenterology

## 2022-12-07 ENCOUNTER — Encounter
Admission: RE | Disposition: A | Discharge status: Home / Self Care | Payer: Self-pay | Source: Ambulatory Visit | Attending: Gastroenterology

## 2022-12-07 ENCOUNTER — Encounter: Payer: Self-pay | Admitting: Gastroenterology

## 2022-12-07 ENCOUNTER — Other Ambulatory Visit: Payer: Self-pay

## 2022-12-07 DIAGNOSIS — Z860100 Personal history of colon polyps, unspecified: Secondary | ICD-10-CM

## 2022-12-07 DIAGNOSIS — E119 Type 2 diabetes mellitus without complications: Secondary | ICD-10-CM | POA: Insufficient documentation

## 2022-12-07 DIAGNOSIS — Z87891 Personal history of nicotine dependence: Secondary | ICD-10-CM | POA: Insufficient documentation

## 2022-12-07 DIAGNOSIS — G4730 Sleep apnea, unspecified: Secondary | ICD-10-CM | POA: Diagnosis not present

## 2022-12-07 DIAGNOSIS — Z1211 Encounter for screening for malignant neoplasm of colon: Secondary | ICD-10-CM | POA: Diagnosis not present

## 2022-12-07 DIAGNOSIS — Z794 Long term (current) use of insulin: Secondary | ICD-10-CM | POA: Diagnosis not present

## 2022-12-07 DIAGNOSIS — E785 Hyperlipidemia, unspecified: Secondary | ICD-10-CM | POA: Insufficient documentation

## 2022-12-07 DIAGNOSIS — J45909 Unspecified asthma, uncomplicated: Secondary | ICD-10-CM | POA: Diagnosis not present

## 2022-12-07 DIAGNOSIS — F419 Anxiety disorder, unspecified: Secondary | ICD-10-CM | POA: Insufficient documentation

## 2022-12-07 DIAGNOSIS — K5730 Diverticulosis of large intestine without perforation or abscess without bleeding: Secondary | ICD-10-CM | POA: Insufficient documentation

## 2022-12-07 DIAGNOSIS — I10 Essential (primary) hypertension: Secondary | ICD-10-CM | POA: Diagnosis not present

## 2022-12-07 HISTORY — PX: COLONOSCOPY WITH PROPOFOL: SHX5780

## 2022-12-07 SURGERY — COLONOSCOPY WITH PROPOFOL
Anesthesia: General

## 2022-12-07 MED ORDER — PROPOFOL 10 MG/ML IV BOLUS
INTRAVENOUS | Status: DC | PRN
  Administered 2022-12-07: 60 mg via INTRAVENOUS

## 2022-12-07 MED ORDER — PROPOFOL 500 MG/50ML IV EMUL
INTRAVENOUS | Status: DC | PRN
  Administered 2022-12-07: 140 ug/kg/min via INTRAVENOUS

## 2022-12-07 MED ORDER — SODIUM CHLORIDE 0.9 % IV SOLN: INTRAVENOUS | Status: DC

## 2022-12-07 MED ORDER — LIDOCAINE HCL (CARDIAC) PF 100 MG/5ML IV SOSY
PREFILLED_SYRINGE | INTRAVENOUS | Status: DC | PRN
  Administered 2022-12-07: 60 mg via INTRAVENOUS

## 2022-12-07 NOTE — H&P (Signed)
Lucilla Lame, MD [address] [place], [place] [address] Phone:[phone] Fax : [phone]  Primary Care Physician:  Leonel Ramsay, MD Primary Gastroenterologist:  Dr. Allen Norris  Pre-Procedure History & Physical: HPI:  Jessica Elliott is a 65 y.o. female is here for an colonoscopy.   Past Medical History:  Diagnosis Date   Allergic rhinitis, seasonal    Diabetes mellitus without complication ([place])    Hyperlipidemia    Hypertension    Polyp of colon    Sleep apnea     Past Surgical History:  Procedure Laterality Date   ABDOMINAL HYSTERECTOMY  1995   still have 1 ovary   bone spur     removed from right shoulder   BREAST BIOPSY Right 12/05/2019   stereo bx/ x clip/  fibroadenoma   COLONOSCOPY WITH PROPOFOL N/A 11/22/2017   Procedure: COLONOSCOPY WITH PROPOFOL;  Surgeon: Lucilla Lame, MD;  Location: [facility] ENDOSCOPY;  Service: Endoscopy;  Laterality: N/A;   SHOULDER ARTHROSCOPY     SHOULDER SURGERY Right 2007    Prior to Admission medications   Medication Sig Start Date End Date Taking? Authorizing Provider  albuterol (VENTOLIN HFA) 108 (90 Base) MCG/ACT inhaler Inhale 1-2 puffs into the lungs every 6 (six) hours as needed for wheezing or shortness of breath. 04/01/22  Yes Hazel Sams, PA-C  ALPRAZolam Duanne Moron) 0.5 MG tablet TAKE 1/2 TO 1 TABLET BY MOUTH 3 TIMES ASNEEDED FOR ANXIETY. 06/11/21  Yes Chrismon, Vickki Muff, PA-C  aspirin 81 MG tablet Take 81 mg by mouth daily.   Yes [provider]  benazepril (LOTENSIN) 10 MG tablet Take 1 tablet (10 mg total) by mouth daily. 09/01/21  Yes Gwyneth Sprout, FNP  benazepril (LOTENSIN) 10 MG tablet Take 1 tablet by mouth daily. 06/29/22  Yes [provider]  ezetimibe (ZETIA) 10 MG tablet Take 1 tablet (10 mg total) by mouth daily. 04/10/21  Yes Bacigalupo, Dionne Bucy, MD  furosemide (LASIX) 20 MG tablet Take 1 tablet (20 mg total) by mouth daily. Please schedule office visit before any future refill.  04/15/22  Yes Tally Joe T, FNP  insulin aspart (NOVOLOG) 100 UNIT/ML injection Inject into the skin. 16-18-20 three times daily   Yes [provider]  Insulin Glargine 300 UNIT/ML SOPN INJECT 70 UNITS EVERY NIGHT AS DIRECTED 11/16/17  Yes [provider]  loratadine (CLARITIN) 10 MG tablet Take 1 tablet by mouth daily as needed. Reported on 01/02/2016 05/28/14  Yes [provider]  Multiple Vitamin (MULTI-VITAMINS) TABS Take by mouth.   Yes [provider]  pantoprazole (PROTONIX) 40 MG tablet TAKE ONE TABLET BY MOUTH EVERY DAY 06/30/21  Yes Bacigalupo, Dionne Bucy, MD  Vitamin D, Ergocalciferol, (DRISDOL) 1.25 MG (50000 UNIT) CAPS capsule TAKE 1 CAPSULE BY MOUTH EACH WEEK 08/26/21  Yes Gwyneth Sprout, FNP  benzonatate (TESSALON) 100 MG capsule Take 2 capsules (200 mg total) by mouth 3 (three) times daily as needed for cough. Patient not taking: Reported on 12/07/2022 07/11/22   Margarette Canada, NP  fluticasone [facility]) 50 MCG/ACT nasal spray Place 2 sprays into both nostrils daily. Patient taking differently: Place 2 sprays into both nostrils as needed. 09/09/16   Mar Daring, PA-C  influenza vac split quadrivalent PF (FLUARIX QUADRIVALENT) 0.5 ML injection Inject into the muscle. 10/07/21   Carlyle Basques, MD  influenza vac split quadrivalent PF (FLUARIX QUADRIVALENT) 0.5 ML injection Inject into the muscle. 09/28/22   Carlyle Basques, MD  [facility] VERIO test  strip  07/26/15   [provider]  promethazine-dextromethorphan (PROMETHAZINE-DM) 6.25-15 MG/5ML syrup Take 5 mLs by mouth 4 (four) times daily as needed. 07/11/22   Margarette Canada, NP  ULTICARE SHORT PEN NEEDLES 31G X 8 MM MISC USE AS DIRECTED FOUR TIMES DAILY 02/19/19   Mar Daring, PA-C    Allergies as of 10/21/2022 - Review Complete 07/11/2022  Allergen Reaction Noted   Ibuprofen Other (See Comments) 04/29/2015   Lisinopril Other (See Comments) 06/28/2017   Statins Other (See Comments)  04/29/2015   Codeine Hives 02/28/2013   Penicillins Hives 02/28/2013    Family History  Problem Relation Age of Onset   Emphysema Mother    Hyperlipidemia Mother    Diabetes Father    Aneurysm Father    Cancer Brother    Hyperlipidemia Brother    Pneumonia Brother    Diabetes Sister    Hypertension Sister    Diverticulitis Sister    Stroke Sister    Diabetes Sister    Hypertension Sister    Heart attack Sister    Diabetes Sister    Hypertension Sister    Cancer Sister    Heart disease Sister    Emphysema Sister    Diabetes Brother    Hyperlipidemia Brother    Hypertension Brother    Crohn's disease Brother    Breast cancer Maternal Aunt    Breast cancer Other     Social History   Socioeconomic History   Marital status: Widowed    Spouse name: Not on file   Number of children: 1   Years of education: Not on file   Highest education level: Not on file  Occupational History    CommentAstronomer of customer service call center  Tobacco Use   Smoking status: Former   Smokeless tobacco: Never  Vaping Use   Vaping Use: Never used  Substance and Sexual Activity   Alcohol use: Yes    Alcohol/week: 0.0 standard drinks of alcohol    Comment: occasionally   Drug use: No   Sexual activity: Not on file  Other Topics Concern   Not on file  Social History Narrative   Not on file   Social Determinants of Health   Financial Resource Strain: Not on file  Food Insecurity: Not on file  Transportation Needs: Not on file  Physical Activity: Not on file  Stress: Not on file  Social Connections: Not on file  Intimate Partner Violence: Not on file    Review of Systems: See HPI, otherwise negative ROS  Physical Exam: BP 131/71   Pulse 90   Temp 97.6 F (36.4 C) (Temporal)   Resp 20   Ht '5\' 2"'$  (1.575 m)   Wt 96.9 kg   SpO2 98%   BMI 39.07 kg/m  General:   Alert,  pleasant and cooperative in NAD Head:  Normocephalic and atraumatic. Neck:  Supple; no masses or  thyromegaly. Lungs:  Clear throughout to auscultation.    Heart:  Regular rate and rhythm. Abdomen:  Soft, nontender and nondistended. Normal bowel sounds, without guarding, and without rebound.   Neurologic:  Alert and  oriented x4;  grossly normal neurologically.  Impression/Plan: Jessica Elliott is here for an colonoscopy to be performed for a history of adenomatous polyps on 2018  Risks, benefits, limitations, and alternatives regarding  colonoscopy have been reviewed with the patient.  Questions have been answered.  All parties agreeable.   Lucilla Lame, MD  12/07/2022, 8:48 AM

## 2022-12-07 NOTE — Anesthesia Postprocedure Evaluation (Signed)
Anesthesia Post Note  Patient: Jessica Elliott  Procedure(s) Performed: COLONOSCOPY WITH PROPOFOL  Patient location during evaluation: Endoscopy Anesthesia Type: General Level of consciousness: awake and alert Pain management: pain level controlled Vital Signs Assessment: post-procedure vital signs reviewed and stable Respiratory status: spontaneous breathing, nonlabored ventilation and respiratory function stable Cardiovascular status: blood pressure returned to baseline and stable Postop Assessment: no apparent nausea or vomiting Anesthetic complications: no   No notable events documented.   Last Vitals:  Vitals:   12/07/22 0920 12/07/22 0950  BP: (!) 155/55 132/65  Pulse:    Resp:    Temp: (!) 36.4 C   SpO2:      Last Pain:  Vitals:   12/07/22 0950  TempSrc:   PainSc: 0-No pain                 Alphonsus Sias

## 2022-12-07 NOTE — Op Note (Signed)
[facility] Gastroenterology Patient Name: Jessica Elliott Procedure Date: 12/07/2022 8:52 AM MRN: [id number] Account #: [id number] Date of Birth: 1956/06/08 Admit Type: Outpatient Age: 66 Room: [facility] ENDO ROOM 4 Gender: Female Note Status: Finalized Instrument Name: Jasper Riling [id number] Procedure:             Colonoscopy Indications:           High risk colon cancer surveillance: Personal history                         of colonic polyps Providers:             Lucilla Lame MD, MD Medicines:             Propofol per Anesthesia Complications:         No immediate complications. Procedure:             Pre-Anesthesia Assessment:                        - Prior to the procedure, a History and Physical was                         performed, and patient medications and allergies were                         reviewed. The patient's tolerance of previous                         anesthesia was also reviewed. The risks and benefits                         of the procedure and the sedation options and risks                         were discussed with the patient. All questions were                         answered, and informed consent was obtained. Prior                         Anticoagulants: The patient has taken no anticoagulant                         or antiplatelet agents. ASA Grade Assessment: II - A                         patient with mild systemic disease. After reviewing                         the risks and benefits, the patient was deemed in                         satisfactory condition to undergo the procedure.                        After obtaining informed consent, the colonoscope was                         passed under direct vision. Throughout  the procedure,                         the patient's blood pressure, pulse, and oxygen                         saturations were monitored continuously. The                         Colonoscope was introduced through  the anus and                         advanced to the the cecum, identified by appendiceal                         orifice and ileocecal valve. The colonoscopy was                         performed without difficulty. The patient tolerated                         the procedure well. The quality of the bowel                         preparation was good. Findings:      The perianal and digital rectal examinations were normal.      A tattoo was seen in the rectum.      Multiple small-mouthed diverticula were found in the sigmoid colon. Impression:            - A tattoo was seen in the rectum.                        - Diverticulosis in the sigmoid colon.                        - No specimens collected. Recommendation:        - Discharge patient to home.                        - Resume previous diet.                        - Continue present medications.                        - Repeat colonoscopy in 7 years for surveillance. Procedure Code(s):     --- Professional ---                        [phone], Colonoscopy, flexible; diagnostic, including                         collection of specimen(s) by brushing or washing, when                         performed (separate procedure) Diagnosis Code(s):     --- Professional ---                        Z86.010, Personal history of colonic polyps CPT copyright 2022 American Medical Association.  All rights reserved. The codes documented in this report are preliminary and upon coder review may  be revised to meet current compliance requirements. Lucilla Lame MD, MD 12/07/2022 9:19:16 AM This report has been signed electronically. Number of Addenda: 0 Note Initiated On: 12/07/2022 8:52 AM Scope Withdrawal Time: 0 hours 8 minutes 20 seconds  Total Procedure Duration: 0 hours 12 minutes 50 seconds  Estimated Blood Loss:  Estimated blood loss: none.      [facility]

## 2022-12-07 NOTE — Anesthesia Preprocedure Evaluation (Addendum)
Anesthesia Evaluation  Patient identified by MRN, date of birth, ID band Patient awake    Reviewed: Allergy & Precautions, NPO status , Patient's Chart, lab work & pertinent test results  Airway Mallampati: III  TM Distance: >3 FB Neck ROM: full    Dental  (+) Chipped   Pulmonary asthma , sleep apnea and Continuous Positive Airway Pressure Ventilation , former smoker   Pulmonary exam normal        Cardiovascular hypertension, Normal cardiovascular exam     Neuro/Psych  PSYCHIATRIC DISORDERS Anxiety      Neuromuscular disease negative neurological ROS  negative psych ROS   GI/Hepatic negative GI ROS, Neg liver ROS,,,  Endo/Other  diabetes    Renal/GU negative Renal ROS  negative genitourinary   Musculoskeletal  (+) Arthritis ,    Abdominal   Peds  Hematology negative hematology ROS (+) Blood dyscrasia, anemia   Anesthesia Other Findings Past Medical History: No date: Allergic rhinitis, seasonal No date: Diabetes mellitus without complication (HCC) No date: Hyperlipidemia No date: Hypertension No date: Polyp of colon No date: Sleep apnea  Past Surgical History: 1995: ABDOMINAL HYSTERECTOMY     Comment:  still have 1 ovary No date: bone spur     Comment:  removed from right shoulder 12/05/2019: BREAST BIOPSY; Right     Comment:  stereo bx/ x clip/  fibroadenoma 11/22/2017: COLONOSCOPY WITH PROPOFOL; N/A     Comment:  Procedure: COLONOSCOPY WITH PROPOFOL;  Surgeon: Lucilla Lame, MD;  Location: [facility] ENDOSCOPY;  Service:               Endoscopy;  Laterality: N/A; No date: SHOULDER ARTHROSCOPY 2007: SHOULDER SURGERY; Right  BMI    Body Mass Index: 39.07 kg/m      Reproductive/Obstetrics negative OB ROS                             Anesthesia Physical Anesthesia Plan  ASA: 3  Anesthesia Plan: General   Post-op Pain Management:    Induction: Intravenous  PONV  Risk Score and Plan: Propofol infusion and TIVA  Airway Management Planned: Natural Airway and Nasal Cannula  Additional Equipment:   Intra-op Plan:   Post-operative Plan:   Informed Consent: I have reviewed the patients History and Physical, chart, labs and discussed the procedure including the risks, benefits and alternatives for the proposed anesthesia with the patient or authorized representative who has indicated his/her understanding and acceptance.     Dental Advisory Given  Plan Discussed with: Anesthesiologist, CRNA and Surgeon  Anesthesia Plan Comments: (Patient consented for risks of anesthesia including but not limited to:  - adverse reactions to medications - risk of airway placement if required - damage to eyes, teeth, lips or other oral mucosa - nerve damage due to positioning  - sore throat or hoarseness - Damage to heart, brain, nerves, lungs, other parts of body or loss of life  Patient voiced understanding.)        Anesthesia Quick Evaluation

## 2022-12-07 NOTE — Transfer of Care (Signed)
Immediate Anesthesia Transfer of Care Note  Patient: Jessica Elliott  Procedure(s) Performed: COLONOSCOPY WITH PROPOFOL  Patient Location: PACU  Anesthesia Type:General  Level of Consciousness: awake, alert , and oriented  Airway & Oxygen Therapy: Patient Spontanous Breathing  Post-op Assessment: Report given to RN and Post -op Vital signs reviewed and stable  Post vital signs: Reviewed and stable  Last Vitals:  Vitals Value Taken Time  BP 115/53   Temp    Pulse 92 12/07/22 0920  Resp 16 12/07/22 0920  SpO2 99 % 12/07/22 0920  Vitals shown include unvalidated device data.  Last Pain:  Vitals:   12/07/22 0821  TempSrc: Temporal  PainSc: 0-No pain         Complications: No notable events documented.

## 2022-12-08 ENCOUNTER — Encounter: Payer: Self-pay | Admitting: Gastroenterology

## 2023-02-17 DIAGNOSIS — R8290 Unspecified abnormal findings in urine: Secondary | ICD-10-CM | POA: Diagnosis not present

## 2023-02-17 DIAGNOSIS — N390 Urinary tract infection, site not specified: Secondary | ICD-10-CM | POA: Diagnosis not present

## 2023-03-22 DIAGNOSIS — E785 Hyperlipidemia, unspecified: Secondary | ICD-10-CM | POA: Diagnosis not present

## 2023-03-22 DIAGNOSIS — E1169 Type 2 diabetes mellitus with other specified complication: Secondary | ICD-10-CM | POA: Diagnosis not present

## 2023-03-30 DIAGNOSIS — R309 Painful micturition, unspecified: Secondary | ICD-10-CM | POA: Diagnosis not present

## 2023-03-30 DIAGNOSIS — E113293 Type 2 diabetes mellitus with mild nonproliferative diabetic retinopathy without macular edema, bilateral: Secondary | ICD-10-CM | POA: Diagnosis not present

## 2023-03-30 DIAGNOSIS — Z794 Long term (current) use of insulin: Secondary | ICD-10-CM | POA: Diagnosis not present

## 2023-04-01 DIAGNOSIS — R39198 Other difficulties with micturition: Secondary | ICD-10-CM | POA: Diagnosis not present

## 2023-04-01 DIAGNOSIS — R8290 Unspecified abnormal findings in urine: Secondary | ICD-10-CM | POA: Diagnosis not present

## 2023-04-01 DIAGNOSIS — R32 Unspecified urinary incontinence: Secondary | ICD-10-CM | POA: Diagnosis not present

## 2023-04-20 ENCOUNTER — Ambulatory Visit (INDEPENDENT_AMBULATORY_CARE_PROVIDER_SITE_OTHER): Payer: PPO | Admitting: Urology

## 2023-04-20 ENCOUNTER — Encounter: Payer: Self-pay | Admitting: Urology

## 2023-04-20 VITALS — BP 115/69 | HR 85 | Ht 62.0 in | Wt 215.0 lb

## 2023-04-20 DIAGNOSIS — R8281 Pyuria: Secondary | ICD-10-CM | POA: Diagnosis not present

## 2023-04-20 DIAGNOSIS — N3941 Urge incontinence: Secondary | ICD-10-CM

## 2023-04-20 DIAGNOSIS — R399 Unspecified symptoms and signs involving the genitourinary system: Secondary | ICD-10-CM

## 2023-04-20 LAB — MICROSCOPIC EXAMINATION

## 2023-04-20 LAB — URINALYSIS, COMPLETE
Bilirubin, UA: NEGATIVE
Glucose, UA: NEGATIVE
Nitrite, UA: NEGATIVE
RBC, UA: NEGATIVE
Specific Gravity, UA: 1.02 (ref 1.005–1.030)
Urobilinogen, Ur: 1 mg/dL (ref 0.2–1.0)
pH, UA: 5.5 (ref 5.0–7.5)

## 2023-04-20 LAB — BLADDER SCAN AMB NON-IMAGING: Scan Result: 87

## 2023-04-20 MED ORDER — TAMSULOSIN HCL 0.4 MG PO CAPS: 0.4000 mg | ORAL_CAPSULE | Freq: Every day | ORAL | 0 refills | Status: DC

## 2023-04-20 NOTE — Progress Notes (Signed)
I, Duke Salvia, acting as a Neurosurgeon for Riki Altes, MD., have documented all relevant documentation on the behalf of Riki Altes, MD, as directed by  Riki Altes, MD while in the presence of Riki Altes, MD.  04/20/2023 4:52 PM   [name] [birth year] [id number]  Referring provider: Mick Sell, MD [address] [place],  [place] [address]  Chief Complaint  Patient presents with   Establish Care   Urinary Incontinence    HPI: Jessica Elliott is a 67 y.o. female referred for urinary incontinence.  Saw PCP 04/01/2023 for her annual visit and had complaints of malodorous urine, urinary hesitancy, and urinary incontinence. She had two negative UAs on 4/10 and 4/12 and urine cultures were negative. Several month history of urinary hesitancy, slow stream and difficulty urinating when she gets up in the morning. Also notes mild odorish urine, symptoms improve after taking a diuretic, however, towards the end of the day she will have recurrent symptoms. No gross hematuria. She has mild stress urinary incontinence. Prior hysterectomy in 1994, no prior vaginal deliveries.   PMH: Past Medical History:  Diagnosis Date   Allergic rhinitis, seasonal    Diabetes mellitus without complication (HCC)    Hyperlipidemia    Hypertension    Polyp of colon    Sleep apnea     Surgical History: Past Surgical History:  Procedure Laterality Date   ABDOMINAL HYSTERECTOMY  1995   still have 1 ovary   bone spur     removed from right shoulder   BREAST BIOPSY Right 12/05/2019   stereo bx/ x clip/  fibroadenoma   COLONOSCOPY WITH PROPOFOL N/A 11/22/2017   Procedure: COLONOSCOPY WITH PROPOFOL;  Surgeon: Midge Minium, MD;  Location: [facility] ENDOSCOPY;  Service: Endoscopy;  Laterality: N/A;   COLONOSCOPY WITH PROPOFOL N/A 12/07/2022   Procedure: COLONOSCOPY WITH PROPOFOL;  Surgeon: Midge Minium, MD;  Location: [facility] ENDOSCOPY;  Service: Endoscopy;  Laterality: N/A;    SHOULDER ARTHROSCOPY     SHOULDER SURGERY Right 2007    Home Medications:  Allergies as of 04/20/2023       Reactions   Ibuprofen Other (See Comments)   Drowsy   Lisinopril Other (See Comments)   hematuria   Statins Other (See Comments)   Codeine Hives   Penicillins Hives        Medication List        Accurate as of Apr 20, 2023  4:52 PM. If you have any questions, ask your nurse or doctor.          STOP taking these medications    benzonatate 100 MG capsule Commonly known as: TESSALON Stopped by: Riki Altes, MD   Fluarix Quadrivalent 0.5 ML injection Generic drug: influenza vac split quadrivalent PF Stopped by: Riki Altes, MD   OneTouch Verio test strip Generic drug: glucose blood Stopped by: Riki Altes, MD   promethazine-dextromethorphan 6.25-15 MG/5ML syrup Commonly known as: PROMETHAZINE-DM Stopped by: Riki Altes, MD   Vitamin D (Ergocalciferol) 1.25 MG (50000 UNIT) Caps capsule Commonly known as: DRISDOL Stopped by: Riki Altes, MD       TAKE these medications    albuterol 108 (90 Base) MCG/ACT inhaler Commonly known as: VENTOLIN HFA Inhale 1-2 puffs into the lungs every 6 (six) hours as needed for wheezing or shortness of breath.   ALPRAZolam 0.5 MG tablet Commonly known as: XANAX TAKE 1/2 TO 1 TABLET BY MOUTH 3 TIMES ASNEEDED FOR  ANXIETY.   aspirin 81 MG tablet Take 81 mg by mouth daily.   benazepril 10 MG tablet Commonly known as: LOTENSIN Take 1 tablet by mouth daily. What changed: Another medication with the same name was removed. Continue taking this medication, and follow the directions you see here. Changed by: Riki Altes, MD   ezetimibe 10 MG tablet Commonly known as: Zetia Take 1 tablet (10 mg total) by mouth daily.   fluticasone 50 MCG/ACT nasal spray Commonly known as: FLONASE Place 2 sprays into both nostrils daily. What changed:  when to take this reasons to take this   furosemide 20 MG  tablet Commonly known as: LASIX Take 1 tablet (20 mg total) by mouth daily. Please schedule office visit before any future refill.   insulin aspart 100 UNIT/ML injection Commonly known as: novoLOG Inject into the skin. 16-18-20 three times daily   Insulin Glargine 300 UNIT/ML Sopn INJECT 70 UNITS EVERY NIGHT AS DIRECTED   loratadine 10 MG tablet Commonly known as: CLARITIN Take 1 tablet by mouth daily as needed. Reported on 01/02/2016   Multi-Vitamins Tabs Take by mouth.   pantoprazole 40 MG tablet Commonly known as: PROTONIX TAKE ONE TABLET BY MOUTH EVERY DAY   tamsulosin 0.4 MG Caps capsule Commonly known as: FLOMAX Take 1 capsule (0.4 mg total) by mouth daily. Started by: Riki Altes, MD   UltiCare Short Pen Needles 31G X 8 MM Misc Generic drug: Insulin Pen Needle USE AS DIRECTED FOUR TIMES DAILY        Allergies:  Allergies  Allergen Reactions   Ibuprofen Other (See Comments)    Drowsy   Lisinopril Other (See Comments)    hematuria   Statins Other (See Comments)   Codeine Hives   Penicillins Hives    Family History: Family History  Problem Relation Age of Onset   Emphysema Mother    Hyperlipidemia Mother    Diabetes Father    Aneurysm Father    Cancer Brother    Hyperlipidemia Brother    Pneumonia Brother    Diabetes Sister    Hypertension Sister    Diverticulitis Sister    Stroke Sister    Diabetes Sister    Hypertension Sister    Heart attack Sister    Diabetes Sister    Hypertension Sister    Cancer Sister    Heart disease Sister    Emphysema Sister    Diabetes Brother    Hyperlipidemia Brother    Hypertension Brother    Crohn's disease Brother    Breast cancer Maternal Aunt    Breast cancer Other     Social History:  reports that she has quit smoking. She has never used smokeless tobacco. She reports current alcohol use. She reports that she does not use drugs.   Physical Exam: BP 115/69   Pulse 85   Ht 5\' 2"  (1.575 m)    Wt 215 lb (97.5 kg)   BMI 39.32 kg/m   Constitutional:  Alert and oriented, No acute distress. HEENT: [place] AT, moist mucus membranes.  Trachea midline, no masses. Cardiovascular: No clubbing, cyanosis, or edema. Respiratory: Normal respiratory effort, no increased work of breathing. GI: Abdomen is soft, nontender, nondistended, no abdominal masses GU: No CVA tenderness Skin: No rashes, bruises or suspicious lesions. Neurologic: Grossly intact, no focal deficits, moving all 4 extremities. Psychiatric: Normal mood and affect.  Laboratory Data:   Urinalysis Microscopy 11-30 WBC.    Assessment & Plan:    1.  Obstructive voiding symptoms Schedule cystoscopy. Trial Tamsulosin 0.4 mg daily. PVR 87 mL.  2. Pyuria Urine culture repeated today.  [facility] [address], [address] [place], [place] [address] [phone]

## 2023-04-25 LAB — CULTURE, URINE COMPREHENSIVE

## 2023-05-13 ENCOUNTER — Ambulatory Visit (INDEPENDENT_AMBULATORY_CARE_PROVIDER_SITE_OTHER): Payer: PPO | Admitting: Urology

## 2023-05-13 ENCOUNTER — Encounter: Payer: Self-pay | Admitting: Urology

## 2023-05-13 VITALS — BP 119/69 | HR 96 | Ht 62.0 in | Wt 215.0 lb

## 2023-05-13 DIAGNOSIS — R399 Unspecified symptoms and signs involving the genitourinary system: Secondary | ICD-10-CM | POA: Diagnosis not present

## 2023-05-13 DIAGNOSIS — R8281 Pyuria: Secondary | ICD-10-CM | POA: Diagnosis not present

## 2023-05-13 LAB — URINALYSIS, COMPLETE
Bilirubin, UA: NEGATIVE
Glucose, UA: NEGATIVE
Ketones, UA: NEGATIVE
Nitrite, UA: NEGATIVE
Protein,UA: NEGATIVE
RBC, UA: NEGATIVE
Specific Gravity, UA: 1.02 (ref 1.005–1.030)
Urobilinogen, Ur: 0.2 mg/dL (ref 0.2–1.0)
pH, UA: 5.5 (ref 5.0–7.5)

## 2023-05-13 LAB — MICROSCOPIC EXAMINATION

## 2023-05-13 MED ORDER — TAMSULOSIN HCL 0.4 MG PO CAPS: 0.4000 mg | ORAL_CAPSULE | Freq: Every day | ORAL | 11 refills | Status: DC

## 2023-05-13 NOTE — Progress Notes (Signed)
   05/13/23  CC:  Chief Complaint  Patient presents with   Cysto    HPI: Refer to my prior note 04/20/2023.  Urine culture had no significant growth.  She has noted significant improvement in her voiding pattern on tamsulosin  Blood pressure 119/69, pulse 96, height 5\' 2"  (1.575 m), weight 215 lb (97.5 kg). NED. A&Ox3.   No respiratory distress   Abd soft, NT, ND Normal external genitalia with patent urethral meatus  Cystoscopy Procedure Note  Patient identification was confirmed, informed consent was obtained, and patient was prepped using Betadine solution.  Lidocaine jelly was administered per urethral meatus.    Procedure: - Flexible cystoscope introduced, without any difficulty.   - Thorough search of the bladder revealed:    normal urethral meatus    normal urothelium    no stones    no ulcers     no tumors    no urethral polyps    no trabeculation  - Ureteral orifices were normal in position and appearance.  Post-Procedure: - Patient tolerated the procedure well  Assessment/ Plan: No abnormalities on cystoscopy She desires to continue tamsulosin and refill sent to pharmacy Annual follow-up and instructed to call earlier for any worsening voiding symptoms Urinalysis today was clear    Riki Altes, MD

## 2023-07-06 DIAGNOSIS — H6123 Impacted cerumen, bilateral: Secondary | ICD-10-CM | POA: Diagnosis not present

## 2023-07-06 DIAGNOSIS — H60502 Unspecified acute noninfective otitis externa, left ear: Secondary | ICD-10-CM | POA: Diagnosis not present

## 2023-07-25 DIAGNOSIS — E113293 Type 2 diabetes mellitus with mild nonproliferative diabetic retinopathy without macular edema, bilateral: Secondary | ICD-10-CM | POA: Diagnosis not present

## 2023-07-25 DIAGNOSIS — Z794 Long term (current) use of insulin: Secondary | ICD-10-CM | POA: Diagnosis not present

## 2023-07-26 DIAGNOSIS — L821 Other seborrheic keratosis: Secondary | ICD-10-CM | POA: Diagnosis not present

## 2023-07-26 DIAGNOSIS — D2272 Melanocytic nevi of left lower limb, including hip: Secondary | ICD-10-CM | POA: Diagnosis not present

## 2023-07-26 DIAGNOSIS — D2261 Melanocytic nevi of right upper limb, including shoulder: Secondary | ICD-10-CM | POA: Diagnosis not present

## 2023-07-26 DIAGNOSIS — D2271 Melanocytic nevi of right lower limb, including hip: Secondary | ICD-10-CM | POA: Diagnosis not present

## 2023-07-26 DIAGNOSIS — L814 Other melanin hyperpigmentation: Secondary | ICD-10-CM | POA: Diagnosis not present

## 2023-07-26 DIAGNOSIS — L578 Other skin changes due to chronic exposure to nonionizing radiation: Secondary | ICD-10-CM | POA: Diagnosis not present

## 2023-07-26 DIAGNOSIS — D2262 Melanocytic nevi of left upper limb, including shoulder: Secondary | ICD-10-CM | POA: Diagnosis not present

## 2023-07-26 DIAGNOSIS — D225 Melanocytic nevi of trunk: Secondary | ICD-10-CM | POA: Diagnosis not present

## 2023-07-27 DIAGNOSIS — G4733 Obstructive sleep apnea (adult) (pediatric): Secondary | ICD-10-CM | POA: Diagnosis not present

## 2023-08-01 DIAGNOSIS — E113293 Type 2 diabetes mellitus with mild nonproliferative diabetic retinopathy without macular edema, bilateral: Secondary | ICD-10-CM | POA: Diagnosis not present

## 2023-08-01 DIAGNOSIS — E785 Hyperlipidemia, unspecified: Secondary | ICD-10-CM | POA: Diagnosis not present

## 2023-08-01 DIAGNOSIS — E1169 Type 2 diabetes mellitus with other specified complication: Secondary | ICD-10-CM | POA: Diagnosis not present

## 2023-08-01 DIAGNOSIS — I10 Essential (primary) hypertension: Secondary | ICD-10-CM | POA: Diagnosis not present

## 2023-08-01 DIAGNOSIS — Z794 Long term (current) use of insulin: Secondary | ICD-10-CM | POA: Diagnosis not present

## 2023-09-20 DIAGNOSIS — R39198 Other difficulties with micturition: Secondary | ICD-10-CM | POA: Diagnosis not present

## 2023-09-27 DIAGNOSIS — R140 Abdominal distension (gaseous): Secondary | ICD-10-CM | POA: Diagnosis not present

## 2023-09-27 DIAGNOSIS — E113293 Type 2 diabetes mellitus with mild nonproliferative diabetic retinopathy without macular edema, bilateral: Secondary | ICD-10-CM | POA: Diagnosis not present

## 2023-09-27 DIAGNOSIS — I700 Atherosclerosis of aorta: Secondary | ICD-10-CM | POA: Diagnosis not present

## 2023-09-27 DIAGNOSIS — M79642 Pain in left hand: Secondary | ICD-10-CM | POA: Diagnosis not present

## 2023-09-27 DIAGNOSIS — Z794 Long term (current) use of insulin: Secondary | ICD-10-CM | POA: Diagnosis not present

## 2023-09-27 DIAGNOSIS — R202 Paresthesia of skin: Secondary | ICD-10-CM | POA: Diagnosis not present

## 2023-09-27 DIAGNOSIS — M79671 Pain in right foot: Secondary | ICD-10-CM | POA: Diagnosis not present

## 2023-09-27 DIAGNOSIS — M545 Low back pain, unspecified: Secondary | ICD-10-CM | POA: Diagnosis not present

## 2023-09-27 DIAGNOSIS — M79672 Pain in left foot: Secondary | ICD-10-CM | POA: Diagnosis not present

## 2023-09-27 DIAGNOSIS — M79641 Pain in right hand: Secondary | ICD-10-CM | POA: Diagnosis not present

## 2023-09-28 DIAGNOSIS — M79671 Pain in right foot: Secondary | ICD-10-CM | POA: Diagnosis not present

## 2023-09-28 DIAGNOSIS — M79672 Pain in left foot: Secondary | ICD-10-CM | POA: Diagnosis not present

## 2023-10-03 DIAGNOSIS — Z794 Long term (current) use of insulin: Secondary | ICD-10-CM | POA: Diagnosis not present

## 2023-10-03 DIAGNOSIS — E113293 Type 2 diabetes mellitus with mild nonproliferative diabetic retinopathy without macular edema, bilateral: Secondary | ICD-10-CM | POA: Diagnosis not present

## 2023-10-03 DIAGNOSIS — Z1231 Encounter for screening mammogram for malignant neoplasm of breast: Secondary | ICD-10-CM | POA: Diagnosis not present

## 2023-10-03 DIAGNOSIS — M79641 Pain in right hand: Secondary | ICD-10-CM | POA: Diagnosis not present

## 2023-10-03 DIAGNOSIS — R140 Abdominal distension (gaseous): Secondary | ICD-10-CM | POA: Diagnosis not present

## 2023-10-03 DIAGNOSIS — M545 Low back pain, unspecified: Secondary | ICD-10-CM | POA: Diagnosis not present

## 2023-10-03 DIAGNOSIS — R39198 Other difficulties with micturition: Secondary | ICD-10-CM | POA: Diagnosis not present

## 2023-10-03 DIAGNOSIS — R202 Paresthesia of skin: Secondary | ICD-10-CM | POA: Diagnosis not present

## 2023-10-03 DIAGNOSIS — Z0000 Encounter for general adult medical examination without abnormal findings: Secondary | ICD-10-CM | POA: Diagnosis not present

## 2023-10-03 DIAGNOSIS — M79642 Pain in left hand: Secondary | ICD-10-CM | POA: Diagnosis not present

## 2023-11-21 DIAGNOSIS — Z794 Long term (current) use of insulin: Secondary | ICD-10-CM | POA: Diagnosis not present

## 2023-11-21 DIAGNOSIS — A048 Other specified bacterial intestinal infections: Secondary | ICD-10-CM | POA: Diagnosis not present

## 2023-11-21 DIAGNOSIS — E113293 Type 2 diabetes mellitus with mild nonproliferative diabetic retinopathy without macular edema, bilateral: Secondary | ICD-10-CM | POA: Diagnosis not present

## 2023-11-29 DIAGNOSIS — Z794 Long term (current) use of insulin: Secondary | ICD-10-CM | POA: Diagnosis not present

## 2023-11-29 DIAGNOSIS — E113293 Type 2 diabetes mellitus with mild nonproliferative diabetic retinopathy without macular edema, bilateral: Secondary | ICD-10-CM | POA: Diagnosis not present

## 2023-11-29 DIAGNOSIS — A048 Other specified bacterial intestinal infections: Secondary | ICD-10-CM | POA: Diagnosis not present

## 2023-12-09 DIAGNOSIS — R140 Abdominal distension (gaseous): Secondary | ICD-10-CM | POA: Diagnosis not present

## 2023-12-09 DIAGNOSIS — A048 Other specified bacterial intestinal infections: Secondary | ICD-10-CM | POA: Diagnosis not present

## 2024-01-24 DIAGNOSIS — G4733 Obstructive sleep apnea (adult) (pediatric): Secondary | ICD-10-CM | POA: Diagnosis not present

## 2024-01-26 DIAGNOSIS — I10 Essential (primary) hypertension: Secondary | ICD-10-CM | POA: Diagnosis not present

## 2024-01-26 DIAGNOSIS — E1169 Type 2 diabetes mellitus with other specified complication: Secondary | ICD-10-CM | POA: Diagnosis not present

## 2024-01-26 DIAGNOSIS — E785 Hyperlipidemia, unspecified: Secondary | ICD-10-CM | POA: Diagnosis not present

## 2024-01-26 DIAGNOSIS — Z794 Long term (current) use of insulin: Secondary | ICD-10-CM | POA: Diagnosis not present

## 2024-01-26 DIAGNOSIS — E113293 Type 2 diabetes mellitus with mild nonproliferative diabetic retinopathy without macular edema, bilateral: Secondary | ICD-10-CM | POA: Diagnosis not present

## 2024-01-31 DIAGNOSIS — Z794 Long term (current) use of insulin: Secondary | ICD-10-CM | POA: Diagnosis not present

## 2024-01-31 DIAGNOSIS — E785 Hyperlipidemia, unspecified: Secondary | ICD-10-CM | POA: Diagnosis not present

## 2024-01-31 DIAGNOSIS — Z6839 Body mass index (BMI) 39.0-39.9, adult: Secondary | ICD-10-CM | POA: Diagnosis not present

## 2024-01-31 DIAGNOSIS — E66812 Obesity, class 2: Secondary | ICD-10-CM | POA: Diagnosis not present

## 2024-01-31 DIAGNOSIS — E113293 Type 2 diabetes mellitus with mild nonproliferative diabetic retinopathy without macular edema, bilateral: Secondary | ICD-10-CM | POA: Diagnosis not present

## 2024-01-31 DIAGNOSIS — E1169 Type 2 diabetes mellitus with other specified complication: Secondary | ICD-10-CM | POA: Diagnosis not present

## 2024-01-31 DIAGNOSIS — I10 Essential (primary) hypertension: Secondary | ICD-10-CM | POA: Diagnosis not present

## 2024-01-31 DIAGNOSIS — E669 Obesity, unspecified: Secondary | ICD-10-CM | POA: Diagnosis not present

## 2024-02-07 DIAGNOSIS — A048 Other specified bacterial intestinal infections: Secondary | ICD-10-CM | POA: Diagnosis not present

## 2024-02-17 ENCOUNTER — Other Ambulatory Visit: Payer: Self-pay | Admitting: Infectious Diseases

## 2024-02-17 DIAGNOSIS — Z1231 Encounter for screening mammogram for malignant neoplasm of breast: Secondary | ICD-10-CM

## 2024-02-21 DIAGNOSIS — G4733 Obstructive sleep apnea (adult) (pediatric): Secondary | ICD-10-CM | POA: Diagnosis not present

## 2024-02-28 ENCOUNTER — Ambulatory Visit
Admission: RE | Admit: 2024-02-28 | Discharge: 2024-02-28 | Disposition: A | Discharge status: Home / Self Care | Payer: BC Managed Care – PPO | Source: Ambulatory Visit | Attending: Infectious Diseases | Admitting: Infectious Diseases

## 2024-02-28 DIAGNOSIS — Z1231 Encounter for screening mammogram for malignant neoplasm of breast: Secondary | ICD-10-CM

## 2024-03-23 DIAGNOSIS — G4733 Obstructive sleep apnea (adult) (pediatric): Secondary | ICD-10-CM | POA: Diagnosis not present

## 2024-03-30 DIAGNOSIS — M722 Plantar fascial fibromatosis: Secondary | ICD-10-CM | POA: Diagnosis not present

## 2024-03-30 DIAGNOSIS — E113293 Type 2 diabetes mellitus with mild nonproliferative diabetic retinopathy without macular edema, bilateral: Secondary | ICD-10-CM | POA: Diagnosis not present

## 2024-03-30 DIAGNOSIS — G4733 Obstructive sleep apnea (adult) (pediatric): Secondary | ICD-10-CM | POA: Diagnosis not present

## 2024-03-30 DIAGNOSIS — R202 Paresthesia of skin: Secondary | ICD-10-CM | POA: Diagnosis not present

## 2024-03-30 DIAGNOSIS — Z794 Long term (current) use of insulin: Secondary | ICD-10-CM | POA: Diagnosis not present

## 2024-03-30 DIAGNOSIS — A048 Other specified bacterial intestinal infections: Secondary | ICD-10-CM | POA: Diagnosis not present

## 2024-03-30 DIAGNOSIS — M79671 Pain in right foot: Secondary | ICD-10-CM | POA: Diagnosis not present

## 2024-04-22 DIAGNOSIS — G4733 Obstructive sleep apnea (adult) (pediatric): Secondary | ICD-10-CM | POA: Diagnosis not present

## 2024-04-26 DIAGNOSIS — G4733 Obstructive sleep apnea (adult) (pediatric): Secondary | ICD-10-CM | POA: Diagnosis not present

## 2024-04-26 DIAGNOSIS — E113293 Type 2 diabetes mellitus with mild nonproliferative diabetic retinopathy without macular edema, bilateral: Secondary | ICD-10-CM | POA: Diagnosis not present

## 2024-04-26 DIAGNOSIS — Z794 Long term (current) use of insulin: Secondary | ICD-10-CM | POA: Diagnosis not present

## 2024-05-03 DIAGNOSIS — I10 Essential (primary) hypertension: Secondary | ICD-10-CM | POA: Diagnosis not present

## 2024-05-03 DIAGNOSIS — Z6837 Body mass index (BMI) 37.0-37.9, adult: Secondary | ICD-10-CM | POA: Diagnosis not present

## 2024-05-03 DIAGNOSIS — E66812 Obesity, class 2: Secondary | ICD-10-CM | POA: Diagnosis not present

## 2024-05-03 DIAGNOSIS — E1169 Type 2 diabetes mellitus with other specified complication: Secondary | ICD-10-CM | POA: Diagnosis not present

## 2024-05-03 DIAGNOSIS — Z794 Long term (current) use of insulin: Secondary | ICD-10-CM | POA: Diagnosis not present

## 2024-05-03 DIAGNOSIS — E113293 Type 2 diabetes mellitus with mild nonproliferative diabetic retinopathy without macular edema, bilateral: Secondary | ICD-10-CM | POA: Diagnosis not present

## 2024-05-03 DIAGNOSIS — E669 Obesity, unspecified: Secondary | ICD-10-CM | POA: Diagnosis not present

## 2024-05-03 DIAGNOSIS — E785 Hyperlipidemia, unspecified: Secondary | ICD-10-CM | POA: Diagnosis not present

## 2024-05-16 ENCOUNTER — Ambulatory Visit: Payer: Self-pay | Admitting: Urology

## 2024-05-23 DIAGNOSIS — G4733 Obstructive sleep apnea (adult) (pediatric): Secondary | ICD-10-CM | POA: Diagnosis not present

## 2024-06-11 DIAGNOSIS — E113293 Type 2 diabetes mellitus with mild nonproliferative diabetic retinopathy without macular edema, bilateral: Secondary | ICD-10-CM | POA: Diagnosis not present

## 2024-06-11 DIAGNOSIS — H2513 Age-related nuclear cataract, bilateral: Secondary | ICD-10-CM | POA: Diagnosis not present

## 2024-06-22 DIAGNOSIS — G4733 Obstructive sleep apnea (adult) (pediatric): Secondary | ICD-10-CM | POA: Diagnosis not present

## 2024-06-26 DIAGNOSIS — Z794 Long term (current) use of insulin: Secondary | ICD-10-CM | POA: Diagnosis not present

## 2024-06-26 DIAGNOSIS — L84 Corns and callosities: Secondary | ICD-10-CM | POA: Diagnosis not present

## 2024-06-26 DIAGNOSIS — E113293 Type 2 diabetes mellitus with mild nonproliferative diabetic retinopathy without macular edema, bilateral: Secondary | ICD-10-CM | POA: Diagnosis not present

## 2024-06-26 DIAGNOSIS — Z1331 Encounter for screening for depression: Secondary | ICD-10-CM | POA: Diagnosis not present

## 2024-06-27 DIAGNOSIS — H2513 Age-related nuclear cataract, bilateral: Secondary | ICD-10-CM | POA: Diagnosis not present

## 2024-06-27 DIAGNOSIS — H2511 Age-related nuclear cataract, right eye: Secondary | ICD-10-CM | POA: Diagnosis not present

## 2024-06-28 ENCOUNTER — Encounter: Payer: Self-pay | Admitting: Ophthalmology

## 2024-06-29 DIAGNOSIS — Z794 Long term (current) use of insulin: Secondary | ICD-10-CM | POA: Diagnosis not present

## 2024-06-29 DIAGNOSIS — E785 Hyperlipidemia, unspecified: Secondary | ICD-10-CM | POA: Diagnosis not present

## 2024-06-29 DIAGNOSIS — E1169 Type 2 diabetes mellitus with other specified complication: Secondary | ICD-10-CM | POA: Diagnosis not present

## 2024-06-29 DIAGNOSIS — E1159 Type 2 diabetes mellitus with other circulatory complications: Secondary | ICD-10-CM | POA: Diagnosis not present

## 2024-06-29 DIAGNOSIS — I152 Hypertension secondary to endocrine disorders: Secondary | ICD-10-CM | POA: Diagnosis not present

## 2024-06-29 DIAGNOSIS — E113293 Type 2 diabetes mellitus with mild nonproliferative diabetic retinopathy without macular edema, bilateral: Secondary | ICD-10-CM | POA: Diagnosis not present

## 2024-06-29 DIAGNOSIS — E1129 Type 2 diabetes mellitus with other diabetic kidney complication: Secondary | ICD-10-CM | POA: Diagnosis not present

## 2024-06-29 DIAGNOSIS — F411 Generalized anxiety disorder: Secondary | ICD-10-CM | POA: Diagnosis not present

## 2024-06-29 DIAGNOSIS — R809 Proteinuria, unspecified: Secondary | ICD-10-CM | POA: Diagnosis not present

## 2024-06-29 DIAGNOSIS — Z780 Asymptomatic menopausal state: Secondary | ICD-10-CM | POA: Diagnosis not present

## 2024-06-29 DIAGNOSIS — Z0000 Encounter for general adult medical examination without abnormal findings: Secondary | ICD-10-CM | POA: Diagnosis not present

## 2024-06-29 DIAGNOSIS — Z1331 Encounter for screening for depression: Secondary | ICD-10-CM | POA: Diagnosis not present

## 2024-07-04 DIAGNOSIS — Z780 Asymptomatic menopausal state: Secondary | ICD-10-CM | POA: Diagnosis not present

## 2024-07-06 DIAGNOSIS — E119 Type 2 diabetes mellitus without complications: Secondary | ICD-10-CM | POA: Diagnosis not present

## 2024-07-06 DIAGNOSIS — L851 Acquired keratosis [keratoderma] palmaris et plantaris: Secondary | ICD-10-CM | POA: Diagnosis not present

## 2024-07-09 NOTE — Discharge Instructions (Addendum)

## 2024-07-11 ENCOUNTER — Ambulatory Visit
Admission: RE | Admit: 2024-07-11 | Discharge: 2024-07-11 | Disposition: A | Discharge status: Home / Self Care | Attending: Ophthalmology | Admitting: Ophthalmology

## 2024-07-11 ENCOUNTER — Other Ambulatory Visit: Payer: Self-pay

## 2024-07-11 ENCOUNTER — Encounter
Admission: RE | Disposition: A | Discharge status: Home / Self Care | Payer: Self-pay | Source: Home / Self Care | Attending: Ophthalmology

## 2024-07-11 ENCOUNTER — Encounter: Payer: Self-pay | Admitting: Ophthalmology

## 2024-07-11 ENCOUNTER — Ambulatory Visit: Payer: Self-pay | Admitting: Anesthesiology

## 2024-07-11 DIAGNOSIS — E1136 Type 2 diabetes mellitus with diabetic cataract: Secondary | ICD-10-CM | POA: Insufficient documentation

## 2024-07-11 DIAGNOSIS — E1143 Type 2 diabetes mellitus with diabetic autonomic (poly)neuropathy: Secondary | ICD-10-CM | POA: Diagnosis not present

## 2024-07-11 DIAGNOSIS — H2511 Age-related nuclear cataract, right eye: Secondary | ICD-10-CM | POA: Insufficient documentation

## 2024-07-11 DIAGNOSIS — Z794 Long term (current) use of insulin: Secondary | ICD-10-CM | POA: Diagnosis not present

## 2024-07-11 DIAGNOSIS — Z7985 Long-term (current) use of injectable non-insulin antidiabetic drugs: Secondary | ICD-10-CM | POA: Diagnosis not present

## 2024-07-11 DIAGNOSIS — Z87891 Personal history of nicotine dependence: Secondary | ICD-10-CM | POA: Diagnosis not present

## 2024-07-11 DIAGNOSIS — Z7984 Long term (current) use of oral hypoglycemic drugs: Secondary | ICD-10-CM | POA: Diagnosis not present

## 2024-07-11 DIAGNOSIS — I10 Essential (primary) hypertension: Secondary | ICD-10-CM | POA: Insufficient documentation

## 2024-07-11 HISTORY — DX: Unspecified asthma, uncomplicated: J45.909

## 2024-07-11 HISTORY — DX: Type 2 diabetes mellitus with diabetic neuropathy, unspecified: E11.40

## 2024-07-11 HISTORY — PX: CATARACT EXTRACTION W/PHACO: SHX586

## 2024-07-11 HISTORY — DX: Obstructive sleep apnea (adult) (pediatric): G47.33

## 2024-07-11 HISTORY — DX: Type 2 diabetes mellitus with diabetic autonomic (poly)neuropathy: E11.43

## 2024-07-11 HISTORY — DX: Myalgia, unspecified site: M79.10

## 2024-07-11 HISTORY — DX: Generalized anxiety disorder: F41.1

## 2024-07-11 LAB — GLUCOSE, CAPILLARY: Glucose-Capillary: 226 mg/dL — ABNORMAL HIGH (ref 70–99)

## 2024-07-11 SURGERY — PHACOEMULSIFICATION, CATARACT, WITH IOL INSERTION
Anesthesia: Monitor Anesthesia Care | Site: Eye | Laterality: Right

## 2024-07-11 MED ORDER — ARMC OPHTHALMIC DILATING DROPS
1.0000 | OPHTHALMIC | Status: DC | PRN
  Administered 2024-07-11 (×3): 1 via OPHTHALMIC

## 2024-07-11 MED ORDER — SIGHTPATH DOSE#1 BSS IO SOLN
INTRAOCULAR | Status: DC | PRN
  Administered 2024-07-11: 15 mL via INTRAOCULAR

## 2024-07-11 MED ORDER — BRIMONIDINE TARTRATE-TIMOLOL 0.2-0.5 % OP SOLN
OPHTHALMIC | Status: DC | PRN
Start: 2024-07-11 — End: 2024-07-11
  Administered 2024-07-11: 1 [drp] via OPHTHALMIC

## 2024-07-11 MED ORDER — MIDAZOLAM HCL 2 MG/2ML IJ SOLN
INTRAMUSCULAR | Status: DC | PRN
  Administered 2024-07-11 (×2): 1 mg via INTRAVENOUS

## 2024-07-11 MED ORDER — SIGHTPATH DOSE#1 BSS IO SOLN
INTRAOCULAR | Status: DC | PRN
  Administered 2024-07-11: 82 mL via OPHTHALMIC

## 2024-07-11 MED ORDER — MIDAZOLAM HCL 2 MG/2ML IJ SOLN
INTRAMUSCULAR | Status: AC
  Filled 2024-07-11: qty 2

## 2024-07-11 MED ORDER — MOXIFLOXACIN HCL 0.5 % OP SOLN
OPHTHALMIC | Status: DC | PRN
  Administered 2024-07-11: .2 mL via OPHTHALMIC

## 2024-07-11 MED ORDER — FENTANYL CITRATE (PF) 100 MCG/2ML IJ SOLN
INTRAMUSCULAR | Status: AC
Start: 2024-07-11 — End: 2024-07-11
  Filled 2024-07-11: qty 2

## 2024-07-11 MED ORDER — TETRACAINE HCL 0.5 % OP SOLN
OPHTHALMIC | Status: AC
  Filled 2024-07-11: qty 4

## 2024-07-11 MED ORDER — SIGHTPATH DOSE#1 NA HYALUR & NA CHOND-NA HYALUR IO KIT
PACK | INTRAOCULAR | Status: DC | PRN
  Administered 2024-07-11: 1 via OPHTHALMIC

## 2024-07-11 MED ORDER — LACTATED RINGERS IV SOLN: INTRAVENOUS | Status: DC

## 2024-07-11 MED ORDER — FENTANYL CITRATE (PF) 100 MCG/2ML IJ SOLN
INTRAMUSCULAR | Status: DC | PRN
  Administered 2024-07-11 (×2): 50 ug via INTRAVENOUS

## 2024-07-11 MED ORDER — ARMC OPHTHALMIC DILATING DROPS
OPHTHALMIC | Status: AC
  Filled 2024-07-11: qty 0.5

## 2024-07-11 MED ORDER — TETRACAINE HCL 0.5 % OP SOLN
1.0000 [drp] | OPHTHALMIC | Status: DC | PRN
  Administered 2024-07-11 (×3): 1 [drp] via OPHTHALMIC

## 2024-07-11 MED ORDER — LIDOCAINE HCL (PF) 2 % IJ SOLN
INTRAOCULAR | Status: DC | PRN
  Administered 2024-07-11: 2 mL

## 2024-07-11 SURGICAL SUPPLY — 9 items
CATARACT SUITE SIGHTPATH (MISCELLANEOUS) ×1 IMPLANT
FEE CATARACT SUITE SIGHTPATH (MISCELLANEOUS) ×1 IMPLANT
GLOVE BIOGEL PI IND STRL 8 (GLOVE) ×1 IMPLANT
GLOVE SURG LX STRL 7.5 STRW (GLOVE) ×1 IMPLANT
GLOVE SURG SYN 6.5 PF PI BL (GLOVE) ×1 IMPLANT
LENS IOL TECNIS EYHANCE 22.5 (Intraocular Lens) IMPLANT
NDL FILTER BLUNT 18X1 1/2 (NEEDLE) ×1 IMPLANT
NEEDLE FILTER BLUNT 18X1 1/2 (NEEDLE) ×1 IMPLANT
SYR 3ML LL SCALE MARK (SYRINGE) ×1 IMPLANT

## 2024-07-11 NOTE — Anesthesia Postprocedure Evaluation (Signed)
 Anesthesia Post Note  Patient: Jessica Elliott  Procedure(s) Performed: PHACOEMULSIFICATION, CATARACT, WITH IOL INSERTION 8.33 00:41.2 (Right: Eye)  Anesthesia Type: MAC Anesthetic complications: no   No notable events documented.   Last Vitals:  Vitals:   07/11/24 0800 07/11/24 0803  BP: 127/65 127/65  Pulse:  90  Resp:  12  Temp:  (!) 36.2 C  SpO2:  96%    Last Pain:  Vitals:   07/11/24 0803  TempSrc:   PainSc: 0-No pain                 Donny JAYSON Mu

## 2024-07-11 NOTE — Op Note (Signed)
 LOCATION:  [facility]   PREOPERATIVE DIAGNOSIS:    Nuclear sclerotic cataract right eye. H25.11   POSTOPERATIVE DIAGNOSIS:  Nuclear sclerotic cataract right eye.     PROCEDURE:  Phacoemusification with posterior chamber intraocular lens placement of the right eye   ULTRASOUND TIME: Procedure(s): PHACOEMULSIFICATION, CATARACT, WITH IOL INSERTION 8.33 00:41.2 (Right)  LENS:   Implant Name Type Inv. Item Serial No. Manufacturer Lot No. LRB No. Used Action  LENS IOL TECNIS EYHANCE 22.5 - [id number] Intraocular Lens LENS IOL TECNIS EYHANCE 22.5 [id number] [employer]  Right 1 Implanted         SURGEON:  Dene FABIENE Etienne, MD   ANESTHESIA:  Topical with tetracaine  drops and 2% Xylocaine  jelly, augmented with 1% preservative-free intracameral lidocaine .    COMPLICATIONS:  None.   DESCRIPTION OF PROCEDURE:  The patient was identified in the holding room and transported to the operating room and placed in the supine position under the operating microscope.  The right eye was identified as the operative eye and it was prepped and draped in the usual sterile ophthalmic fashion.   A 1 millimeter clear-corneal paracentesis was made at the 12:00 position.  0.5 ml of preservative-free 1% lidocaine  was injected into the anterior chamber. The anterior chamber was filled with Viscoat viscoelastic.  A 2.4 millimeter keratome was used to make a near-clear corneal incision at the 9:00 position.  A curvilinear capsulorrhexis was made with a cystotome and capsulorrhexis forceps.  Balanced salt solution was used to hydrodissect and hydrodelineate the nucleus.   Phacoemulsification was then used in stop and chop fashion to remove the lens nucleus and epinucleus.  The remaining cortex was then removed using the irrigation and aspiration handpiece. Provisc was then placed into the capsular bag to distend it for lens placement.  A lens was then injected into the capsular bag.  The remaining  viscoelastic was aspirated.   Wounds were hydrated with balanced salt solution.  The anterior chamber was inflated to a physiologic pressure with balanced salt solution.  No wound leaks were noted. Cefuroxime 0.1 ml of a 10mg /ml solution was injected into the anterior chamber for a dose of 1 mg of intracameral antibiotic at the completion of the case.   Timolol  and Brimonidine  drops were applied to the eye.  The patient was taken to the recovery room in stable condition without complications of anesthesia or surgery.   Jessica Elliott 07/11/2024, 7:57 AM

## 2024-07-11 NOTE — Transfer of Care (Signed)
 Immediate Anesthesia Transfer of Care Note  Patient: Jessica Elliott  Procedure(s) Performed: PHACOEMULSIFICATION, CATARACT, WITH IOL INSERTION 8.33 00:41.2 (Right: Eye)  Patient Location: PACU  Anesthesia Type: MAC  Level of Consciousness: awake, alert  and patient cooperative  Airway and Oxygen Therapy: Patient Spontanous Breathing and Patient connected to supplemental oxygen  Post-op Assessment: Post-op Vital signs reviewed, Patient's Cardiovascular Status Stable, Respiratory Function Stable, Patent Airway and No signs of Nausea or vomiting  Post-op Vital Signs: Reviewed and stable  Complications: No notable events documented.

## 2024-07-11 NOTE — Anesthesia Postprocedure Evaluation (Signed)
 Anesthesia Post Note  Patient: Psychologist, educational  Procedure(s) Performed: PHACOEMULSIFICATION, CATARACT, WITH IOL INSERTION 8.33 00:41.2 (Right: Eye)  Patient location during evaluation: PACU Anesthesia Type: MAC Level of consciousness: awake and alert Pain management: pain level controlled Vital Signs Assessment: post-procedure vital signs reviewed and stable Respiratory status: spontaneous breathing, nonlabored ventilation, respiratory function stable and patient connected to nasal cannula oxygen Cardiovascular status: stable and blood pressure returned to baseline Postop Assessment: no apparent nausea or vomiting Anesthetic complications: no   No notable events documented.   Last Vitals:  Vitals:   07/11/24 0800 07/11/24 0803  BP: 127/65 127/65  Pulse:  90  Resp:  12  Temp:  (!) 36.2 C  SpO2:  96%    Last Pain:  Vitals:   07/11/24 0803  TempSrc:   PainSc: 0-No pain                 Donny JAYSON Mu

## 2024-07-11 NOTE — H&P (Signed)
 [facility]   Primary Care Physician:  Epifanio Alm SQUIBB, MD Ophthalmologist: Dr. Dene Etienne  Pre-Procedure History & Physical: HPI:  [name] is a 68 y.o. female here for ophthalmic surgery.   Past Medical History:  Diagnosis Date   Allergic rhinitis, seasonal    Asthma    Diabetic neuropathy (HCC)    Generalized anxiety disorder    Hyperlipidemia    Hypertension    Myalgia    OSA on CPAP    Polyp of colon    Sleep apnea    Type 2 diabetes mellitus with diabetic autonomic neuropathy, without long-term current use of insulin [facility])     Past Surgical History:  Procedure Laterality Date   ABDOMINAL HYSTERECTOMY  1995   still have 1 ovary   bone spur     removed from right shoulder   BREAST BIOPSY Right 12/05/2019   stereo bx/ x clip/  fibroadenoma   COLONOSCOPY WITH PROPOFOL  N/A 11/22/2017   Procedure: COLONOSCOPY WITH PROPOFOL ;  Surgeon: Jinny Carmine, MD;  Location: [facility] ENDOSCOPY;  Service: Endoscopy;  Laterality: N/A;   COLONOSCOPY WITH PROPOFOL  N/A 12/07/2022   Procedure: COLONOSCOPY WITH PROPOFOL ;  Surgeon: Jinny Carmine, MD;  Location: [facility] ENDOSCOPY;  Service: Endoscopy;  Laterality: N/A;   SHOULDER ARTHROSCOPY     SHOULDER SURGERY Right 2007    Prior to Admission medications   Medication Sig Start Date End Date Taking? Authorizing Provider  albuterol  (VENTOLIN  HFA) 108 (90 Base) MCG/ACT inhaler Inhale 1-2 puffs into the lungs every 6 (six) hours as needed for wheezing or shortness of breath. 04/01/22  Yes Graham, Laura E, PA-C  ALPRAZolam  (XANAX ) 0.5 MG tablet TAKE 1/2 TO 1 TABLET BY MOUTH 3 TIMES ASNEEDED FOR ANXIETY. 06/11/21  Yes Chrismon, Marinda BRAVO, PA-C  aspirin 81 MG tablet Take 81 mg by mouth daily.   Yes [provider]  benazepril  (LOTENSIN ) 10 MG tablet Take 10 mg by mouth daily.   Yes [provider]  cyanocobalamin  (VITAMIN B12) 1000 MCG tablet Take 1,000 mcg by mouth daily.   Yes [provider]   ezetimibe  (ZETIA ) 10 MG tablet Take 1 tablet (10 mg total) by mouth daily. 04/10/21  Yes Bacigalupo, Angela M, MD  insulin aspart (NOVOLOG) 100 UNIT/ML injection Inject into the skin. 16-18-20 three times daily   Yes [provider]  Insulin Glargine (TOUJEO SOLOSTAR [place]) Inject into the skin.   Yes [provider]  metFORMIN (GLUCOPHAGE-XR) 500 MG 24 hr tablet Take 500 mg by mouth 2 (two) times daily with a meal.   Yes [provider]  Multiple Vitamin (MULTIVITAMIN ADULT PO) Take by mouth.   Yes [provider]  pantoprazole  (PROTONIX ) 40 MG tablet Take 40 mg by mouth 2 (two) times daily.   Yes [provider]  tirzepatide CLOYDE) 5 MG/0.5ML Pen Inject 5 mg into the skin once a week.   Yes [provider]  ULTICARE SHORT PEN NEEDLES 31G X 8 MM MISC USE AS DIRECTED FOUR TIMES DAILY 02/19/19  Yes Burnette, Delon HERO, PA-C  Vitamin D , Ergocalciferol , (DRISDOL) 1.25 MG (50000 UNIT) CAPS capsule Take 50,000 Units by mouth every 7 (seven) days.   Yes [provider]    Allergies as of 06/13/2024 - Review Complete 05/13/2023  Allergen Reaction Noted   Ibuprofen Other (See Comments) 04/29/2015   Lisinopril Other (See Comments) 06/28/2017   Semaglutide  04/06/2022   Statins Other (See Comments) 04/29/2015   Codeine Hives 02/28/2013   Penicillins  Hives 02/28/2013    Family History  Problem Relation Age of Onset   Emphysema Mother    Hyperlipidemia Mother    Diabetes Father    Aneurysm Father    Cancer Brother    Hyperlipidemia Brother    Pneumonia Brother    Diabetes Sister    Hypertension Sister    Diverticulitis Sister    Stroke Sister    Diabetes Sister    Hypertension Sister    Heart attack Sister    Diabetes Sister    Hypertension Sister    Cancer Sister    Heart disease Sister    Emphysema Sister    Diabetes Brother    Hyperlipidemia Brother    Hypertension Brother    Crohn's disease Brother    Breast cancer  Maternal Aunt    Breast cancer Other     Social History   Socioeconomic History   Marital status: Widowed    Spouse name: Not on file   Number of children: 1   Years of education: Not on file   Highest education level: Not on file  Occupational History    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  Tobacco Use   Smoking status: Former   Smokeless tobacco: Never  Vaping Use   Vaping status: Never Used  Substance and Sexual Activity   Alcohol use: Yes    Alcohol/week: 0.0 standard drinks of alcohol    Comment: occasionally   Drug use: No   Sexual activity: Not on file  Other Topics Concern   Not on file  Social History Narrative   Not on file   Social Drivers of Health   Financial Resource Strain: Low Risk  (07/06/2024)   Received from [facility]   Overall Financial Resource Strain (CARDIA)    Difficulty of Paying Living Expenses: Not very hard  Food Insecurity: No Food Insecurity (07/06/2024)   Received from [facility]   Hunger Vital Sign    Within the past 12 months, you worried that your food would run out before you got the money to buy more.: Never true    Within the past 12 months, the food you bought just didn't last and you didn't have money to get more.: Never true  Transportation Needs: No Transportation Needs (07/06/2024)   Received from [facility] - Transportation    In the past 12 months, has lack of transportation kept you from medical appointments or from getting medications?: No    Lack of Transportation (Non-Medical): No  Physical Activity: Not on file  Stress: Not on file  Social Connections: Not on file  Intimate Partner Violence: Not on file    Review of Systems: See HPI, otherwise negative ROS  Physical Exam: BP (!) 135/54   Pulse 93   Temp (!) 97.3 F (36.3 C) (Temporal)   Resp 19   Ht 5' 2 (1.575 m)   Wt 91.1 kg   SpO2 95%   BMI 36.73 kg/m  General:   Alert,   pleasant and cooperative in NAD Head:  Normocephalic and atraumatic. Lungs:  Clear to auscultation.    Heart:  Regular rate and rhythm.   Impression/Plan: Jessica Elliott is here for ophthalmic surgery.  Risks, benefits, limitations, and alternatives regarding ophthalmic surgery have been reviewed with the patient.  Questions have been answered.  All parties agreeable.   MITTIE GASKIN, MD  07/11/2024, 7:34 AM

## 2024-07-11 NOTE — Anesthesia Preprocedure Evaluation (Signed)
 Anesthesia Evaluation  Patient identified by MRN, date of birth, ID band Patient awake    Reviewed: Allergy & Precautions, H&P , NPO status , Patient's Chart, lab work & pertinent test results  Airway Mallampati: IV  TM Distance: <3 FB Neck ROM: Full    Dental no notable dental hx.    Pulmonary neg pulmonary ROS, former smoker   Pulmonary exam normal breath sounds clear to auscultation       Cardiovascular hypertension, negative cardio ROS Normal cardiovascular exam Rhythm:Regular Rate:Normal     Neuro/Psych negative neurological ROS  negative psych ROS   GI/Hepatic negative GI ROS, Neg liver ROS,,,  Endo/Other  negative endocrine ROSdiabetes    Renal/GU negative Renal ROS  negative genitourinary   Musculoskeletal negative musculoskeletal ROS (+)    Abdominal   Peds negative pediatric ROS (+)  Hematology negative hematology ROS (+)   Anesthesia Other Findings Hyperlipidemia Allergic rhinitis, seasonal Polyp of colon Hypertension Sleep apnea Diabetic neuropathy (HCC) Type 2 diabetes mellitus with diabetic autonomic neuropathy, without long-term current use of insulin (HCC) Asthma Myalgia Generalized anxiety disorder    Reproductive/Obstetrics negative OB ROS                              Anesthesia Physical Anesthesia Plan  ASA: 3  Anesthesia Plan: MAC   Post-op Pain Management:    Induction: Intravenous  PONV Risk Score and Plan:   Airway Management Planned: Natural Airway and Nasal Cannula  Additional Equipment:   Intra-op Plan:   Post-operative Plan:   Informed Consent: I have reviewed the patients History and Physical, chart, labs and discussed the procedure including the risks, benefits and alternatives for the proposed anesthesia with the patient or authorized representative who has indicated his/her understanding and acceptance.     Dental Advisory  Given  Plan Discussed with: Anesthesiologist, CRNA and Surgeon  Anesthesia Plan Comments: (Patient consented for risks of anesthesia including but not limited to:  - adverse reactions to medications - damage to eyes, teeth, lips or other oral mucosa - nerve damage due to positioning  - sore throat or hoarseness - Damage to heart, brain, nerves, lungs, other parts of body or loss of life  Patient voiced understanding and assent.)         Anesthesia Quick Evaluation

## 2024-07-16 ENCOUNTER — Encounter: Payer: Self-pay | Admitting: Ophthalmology

## 2024-07-19 ENCOUNTER — Encounter: Payer: Self-pay | Admitting: Ophthalmology

## 2024-07-19 NOTE — Anesthesia Preprocedure Evaluation (Addendum)
 Anesthesia Evaluation  Patient identified by MRN, date of birth, ID band Patient awake    Reviewed: Allergy & Precautions, H&P , NPO status , Patient's Chart, lab work & pertinent test results  Airway Mallampati: IV  TM Distance: <3 FB Neck ROM: Full    Dental no notable dental hx.    Pulmonary neg pulmonary ROS, asthma , sleep apnea , former smoker   Pulmonary exam normal breath sounds clear to auscultation       Cardiovascular hypertension, negative cardio ROS Normal cardiovascular exam Rhythm:Regular Rate:Normal     Neuro/Psych  PSYCHIATRIC DISORDERS Anxiety      Neuromuscular disease negative neurological ROS  negative psych ROS   GI/Hepatic negative GI ROS, Neg liver ROS,,,  Endo/Other  negative endocrine ROSdiabetes    Renal/GU negative Renal ROS  negative genitourinary   Musculoskeletal negative musculoskeletal ROS (+) Arthritis ,    Abdominal   Peds negative pediatric ROS (+)  Hematology negative hematology ROS (+) Blood dyscrasia, anemia   Anesthesia Other Findings 07-11-24 previous cataract surgery Dr. Ola   Hyperlipidemia Allergic rhinitis, seasonal Polyp of colon Hypertension Sleep apnea Diabetic neuropathy  Type 2 diabetes mellitus with diabetic autonomic neuropathy, without long-term current use of insulin Asthma Myalgia Generalized anxiety disorder   Reproductive/Obstetrics negative OB ROS                              Anesthesia Physical Anesthesia Plan  ASA: 3  Anesthesia Plan: MAC   Post-op Pain Management:    Induction: Intravenous  PONV Risk Score and Plan:   Airway Management Planned: Natural Airway and Nasal Cannula  Additional Equipment:   Intra-op Plan:   Post-operative Plan:   Informed Consent: I have reviewed the patients History and Physical, chart, labs and discussed the procedure including the risks, benefits and alternatives for the  proposed anesthesia with the patient or authorized representative who has indicated his/her understanding and acceptance.     Dental Advisory Given  Plan Discussed with: Anesthesiologist, CRNA and Surgeon  Anesthesia Plan Comments: (Patient consented for risks of anesthesia including but not limited to:  - adverse reactions to medications - damage to eyes, teeth, lips or other oral mucosa - nerve damage due to positioning  - sore throat or hoarseness - Damage to heart, brain, nerves, lungs, other parts of body or loss of life  Patient voiced understanding and assent.)         Anesthesia Quick Evaluation

## 2024-07-23 DIAGNOSIS — G4733 Obstructive sleep apnea (adult) (pediatric): Secondary | ICD-10-CM | POA: Diagnosis not present

## 2024-07-26 NOTE — Discharge Instructions (Signed)

## 2024-08-03 DIAGNOSIS — L821 Other seborrheic keratosis: Secondary | ICD-10-CM | POA: Diagnosis not present

## 2024-08-03 DIAGNOSIS — D2272 Melanocytic nevi of left lower limb, including hip: Secondary | ICD-10-CM | POA: Diagnosis not present

## 2024-08-03 DIAGNOSIS — D2271 Melanocytic nevi of right lower limb, including hip: Secondary | ICD-10-CM | POA: Diagnosis not present

## 2024-08-03 DIAGNOSIS — D225 Melanocytic nevi of trunk: Secondary | ICD-10-CM | POA: Diagnosis not present

## 2024-08-03 DIAGNOSIS — D2261 Melanocytic nevi of right upper limb, including shoulder: Secondary | ICD-10-CM | POA: Diagnosis not present

## 2024-08-03 DIAGNOSIS — D2262 Melanocytic nevi of left upper limb, including shoulder: Secondary | ICD-10-CM | POA: Diagnosis not present

## 2024-08-08 ENCOUNTER — Encounter: Payer: Self-pay | Admitting: Ophthalmology

## 2024-08-08 ENCOUNTER — Other Ambulatory Visit: Payer: Self-pay

## 2024-08-08 ENCOUNTER — Ambulatory Visit
Admission: RE | Admit: 2024-08-08 | Discharge: 2024-08-08 | Disposition: A | Discharge status: Home / Self Care | Attending: Ophthalmology | Admitting: Ophthalmology

## 2024-08-08 ENCOUNTER — Encounter
Admission: RE | Disposition: A | Discharge status: Home / Self Care | Payer: Self-pay | Source: Home / Self Care | Attending: Ophthalmology

## 2024-08-08 ENCOUNTER — Ambulatory Visit: Payer: Self-pay | Admitting: Anesthesiology

## 2024-08-08 DIAGNOSIS — Z794 Long term (current) use of insulin: Secondary | ICD-10-CM | POA: Insufficient documentation

## 2024-08-08 DIAGNOSIS — I10 Essential (primary) hypertension: Secondary | ICD-10-CM | POA: Diagnosis not present

## 2024-08-08 DIAGNOSIS — G4730 Sleep apnea, unspecified: Secondary | ICD-10-CM | POA: Diagnosis not present

## 2024-08-08 DIAGNOSIS — J45909 Unspecified asthma, uncomplicated: Secondary | ICD-10-CM | POA: Insufficient documentation

## 2024-08-08 DIAGNOSIS — Z7984 Long term (current) use of oral hypoglycemic drugs: Secondary | ICD-10-CM | POA: Insufficient documentation

## 2024-08-08 DIAGNOSIS — Z87891 Personal history of nicotine dependence: Secondary | ICD-10-CM | POA: Diagnosis not present

## 2024-08-08 DIAGNOSIS — H2512 Age-related nuclear cataract, left eye: Secondary | ICD-10-CM | POA: Insufficient documentation

## 2024-08-08 DIAGNOSIS — E1136 Type 2 diabetes mellitus with diabetic cataract: Secondary | ICD-10-CM | POA: Diagnosis not present

## 2024-08-08 HISTORY — PX: CATARACT EXTRACTION W/PHACO: SHX586

## 2024-08-08 LAB — GLUCOSE, CAPILLARY: Glucose-Capillary: 174 mg/dL — ABNORMAL HIGH (ref 70–99)

## 2024-08-08 SURGERY — PHACOEMULSIFICATION, CATARACT, WITH IOL INSERTION
Anesthesia: Monitor Anesthesia Care | Site: Eye | Laterality: Left

## 2024-08-08 MED ORDER — SIGHTPATH DOSE#1 NA HYALUR & NA CHOND-NA HYALUR IO KIT
PACK | INTRAOCULAR | Status: DC | PRN
  Administered 2024-08-08: 1 via OPHTHALMIC

## 2024-08-08 MED ORDER — TETRACAINE HCL 0.5 % OP SOLN
1.0000 [drp] | OPHTHALMIC | Status: DC | PRN
  Administered 2024-08-08 (×3): 1 [drp] via OPHTHALMIC

## 2024-08-08 MED ORDER — SIGHTPATH DOSE#1 BSS IO SOLN
INTRAOCULAR | Status: DC | PRN
  Administered 2024-08-08: 15 mL via INTRAOCULAR

## 2024-08-08 MED ORDER — BRIMONIDINE TARTRATE-TIMOLOL 0.2-0.5 % OP SOLN
OPHTHALMIC | Status: DC | PRN
  Administered 2024-08-08: 1 [drp] via OPHTHALMIC

## 2024-08-08 MED ORDER — TETRACAINE HCL 0.5 % OP SOLN
OPHTHALMIC | Status: AC
Start: 2024-08-08 — End: 2024-08-08
  Filled 2024-08-08: qty 4

## 2024-08-08 MED ORDER — LACTATED RINGERS IV SOLN: INTRAVENOUS | Status: DC

## 2024-08-08 MED ORDER — FENTANYL CITRATE (PF) 100 MCG/2ML IJ SOLN
INTRAMUSCULAR | Status: AC
Start: 2024-08-08 — End: 2024-08-08
  Filled 2024-08-08: qty 2

## 2024-08-08 MED ORDER — MIDAZOLAM HCL 2 MG/2ML IJ SOLN
INTRAMUSCULAR | Status: AC
  Filled 2024-08-08: qty 2

## 2024-08-08 MED ORDER — MOXIFLOXACIN HCL 0.5 % OP SOLN
OPHTHALMIC | Status: DC | PRN
  Administered 2024-08-08: .2 mL via OPHTHALMIC

## 2024-08-08 MED ORDER — FENTANYL CITRATE (PF) 100 MCG/2ML IJ SOLN
INTRAMUSCULAR | Status: DC | PRN
  Administered 2024-08-08 (×2): 50 ug via INTRAVENOUS

## 2024-08-08 MED ORDER — ARMC OPHTHALMIC DILATING DROPS
OPHTHALMIC | Status: AC
Start: 2024-08-08 — End: 2024-08-08
  Filled 2024-08-08: qty 0.5

## 2024-08-08 MED ORDER — MIDAZOLAM HCL 2 MG/2ML IJ SOLN
INTRAMUSCULAR | Status: DC | PRN
  Administered 2024-08-08 (×2): 1 mg via INTRAVENOUS

## 2024-08-08 MED ORDER — ARMC OPHTHALMIC DILATING DROPS
1.0000 | OPHTHALMIC | Status: DC | PRN
  Administered 2024-08-08 (×3): 1 via OPHTHALMIC

## 2024-08-08 MED ORDER — SIGHTPATH DOSE#1 BSS IO SOLN
INTRAOCULAR | Status: DC | PRN
  Administered 2024-08-08: 67 mL via OPHTHALMIC

## 2024-08-08 MED ORDER — LIDOCAINE HCL (PF) 2 % IJ SOLN
INTRAMUSCULAR | Status: DC | PRN
  Administered 2024-08-08: 2 mL

## 2024-08-08 SURGICAL SUPPLY — 8 items
FEE CATARACT SUITE SIGHTPATH (MISCELLANEOUS) ×1 IMPLANT
GLOVE BIOGEL PI IND STRL 8 (GLOVE) ×1 IMPLANT
GLOVE SURG LX STRL 7.5 STRW (GLOVE) ×1 IMPLANT
GLOVE SURG SYN 6.5 PF PI BL (GLOVE) ×1 IMPLANT
LENS IOL TECNIS EYHANCE 23.0 (Intraocular Lens) IMPLANT
NDL FILTER BLUNT 18X1 1/2 (NEEDLE) ×1 IMPLANT
NEEDLE FILTER BLUNT 18X1 1/2 (NEEDLE) ×1 IMPLANT
SYR 3ML LL SCALE MARK (SYRINGE) ×1 IMPLANT

## 2024-08-08 NOTE — H&P (Signed)
 [facility]   Primary Care Physician:  Epifanio Alm SQUIBB, MD Ophthalmologist: Dr. Dene Etienne  Pre-Procedure History & Physical: HPI:  Jessica Elliott is a 68 y.o. female here for ophthalmic surgery.   Past Medical History:  Diagnosis Date   Allergic rhinitis, seasonal    Asthma    Diabetic neuropathy (HCC)    Generalized anxiety disorder    Hyperlipidemia    Hypertension    Myalgia    OSA on CPAP    Polyp of colon    Sleep apnea    Type 2 diabetes mellitus with diabetic autonomic neuropathy, without long-term current use of insulin [facility])     Past Surgical History:  Procedure Laterality Date   ABDOMINAL HYSTERECTOMY  1995   still have 1 ovary   bone spur     removed from right shoulder   BREAST BIOPSY Right 12/05/2019   stereo bx/ x clip/  fibroadenoma   CATARACT EXTRACTION W/PHACO Right 07/11/2024   Procedure: PHACOEMULSIFICATION, CATARACT, WITH IOL INSERTION 8.33 00:41.2;  Surgeon: Etienne Dene, MD;  Location: [facility];  Service: Ophthalmology;  Laterality: Right;   COLONOSCOPY WITH PROPOFOL  N/A 11/22/2017   Procedure: COLONOSCOPY WITH PROPOFOL ;  Surgeon: Jinny Carmine, MD;  Location: [facility] ENDOSCOPY;  Service: Endoscopy;  Laterality: N/A;   COLONOSCOPY WITH PROPOFOL  N/A 12/07/2022   Procedure: COLONOSCOPY WITH PROPOFOL ;  Surgeon: Jinny Carmine, MD;  Location: [facility] ENDOSCOPY;  Service: Endoscopy;  Laterality: N/A;   SHOULDER ARTHROSCOPY     SHOULDER SURGERY Right 2007    Prior to Admission medications   Medication Sig Start Date End Date Taking? Authorizing Provider  albuterol  (VENTOLIN  HFA) 108 (90 Base) MCG/ACT inhaler Inhale 1-2 puffs into the lungs every 6 (six) hours as needed for wheezing or shortness of breath. 04/01/22  Yes Graham, Laura E, PA-C  ALPRAZolam  (XANAX ) 0.5 MG tablet TAKE 1/2 TO 1 TABLET BY MOUTH 3 TIMES ASNEEDED FOR ANXIETY. 06/11/21  Yes Chrismon, Marinda BRAVO, PA-C  aspirin 81 MG tablet Take 81 mg by mouth daily.   Yes  [provider]  benazepril  (LOTENSIN ) 10 MG tablet Take 10 mg by mouth daily.   Yes [provider]  cyanocobalamin  (VITAMIN B12) 1000 MCG tablet Take 1,000 mcg by mouth daily.   Yes [provider]  ezetimibe  (ZETIA ) 10 MG tablet Take 1 tablet (10 mg total) by mouth daily. 04/10/21  Yes Bacigalupo, Angela M, MD  insulin aspart (NOVOLOG) 100 UNIT/ML injection Inject into the skin. 16-18-20 three times daily   Yes [provider]  Insulin Glargine (TOUJEO SOLOSTAR [place]) Inject into the skin.   Yes [provider]  metFORMIN (GLUCOPHAGE-XR) 500 MG 24 hr tablet Take 500 mg by mouth 2 (two) times daily with a meal.   Yes [provider]  Multiple Vitamin (MULTIVITAMIN ADULT PO) Take by mouth.   Yes [provider]  pantoprazole  (PROTONIX ) 40 MG tablet Take 40 mg by mouth 2 (two) times daily.   Yes [provider]  tirzepatide CLOYDE) 5 MG/0.5ML Pen Inject 5 mg into the skin once a week.   Yes [provider]  Vitamin D , Ergocalciferol , (DRISDOL) 1.25 MG (50000 UNIT) CAPS capsule Take 50,000 Units by mouth every 7 (seven) days.   Yes [provider]  ULTICARE SHORT PEN NEEDLES 31G X 8 MM MISC USE AS DIRECTED FOUR TIMES DAILY 02/19/19   Vivienne Delon HERO, PA-C    Allergies as of 06/13/2024 - Review Complete 05/13/2023  Allergen Reaction Noted  Ibuprofen Other (See Comments) 04/29/2015   Lisinopril Other (See Comments) 06/28/2017   Semaglutide  04/06/2022   Statins Other (See Comments) 04/29/2015   Codeine Hives 02/28/2013   Penicillins Hives 02/28/2013    Family History  Problem Relation Age of Onset   Emphysema Mother    Hyperlipidemia Mother    Diabetes Father    Aneurysm Father    Cancer Brother    Hyperlipidemia Brother    Pneumonia Brother    Diabetes Sister    Hypertension Sister    Diverticulitis Sister    Stroke Sister    Diabetes Sister    Hypertension Sister    Heart attack Sister     Diabetes Sister    Hypertension Sister    Cancer Sister    Heart disease Sister    Emphysema Sister    Diabetes Brother    Hyperlipidemia Brother    Hypertension Brother    Crohn's disease Brother    Breast cancer Maternal Aunt    Breast cancer Other     Social History   Socioeconomic History   Marital status: Widowed    Spouse name: Not on file   Number of children: 1   Years of education: Not on file   Highest education level: Not on file  Occupational History    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  Tobacco Use   Smoking status: Former   Smokeless tobacco: Never  Vaping Use   Vaping status: Never Used  Substance and Sexual Activity   Alcohol use: Yes    Alcohol/week: 0.0 standard drinks of alcohol    Comment: occasionally   Drug use: No   Sexual activity: Not on file  Other Topics Concern   Not on file  Social History Narrative   Not on file   Social Drivers of Health   Financial Resource Strain: Low Risk  (07/06/2024)   Received [facility]   Overall Financial Resource Strain (CARDIA)    Difficulty of Paying Living Expenses: Not very hard  Food Insecurity: No Food Insecurity (07/06/2024)   Received from [facility]   Hunger Vital Sign    Within the past 12 months, you worried that your food would run out before you got the money to buy more.: Never true    Within the past 12 months, the food you bought just didn't last and you didn't have money to get more.: Never true  Transportation Needs: No Transportation Needs (07/06/2024)   Received from [facility]    In the past 12 months, has lack of transportation kept you from medical appointments or from getting medications?: No    Lack of Transportation (Non-Medical): No  Physical Activity: Not on file  Stress: Not on file  Social Connections: Not on file  Intimate Partner Violence: Not on file    Review of  Systems: See HPI, otherwise negative ROS  Physical Exam: BP 139/62   Pulse 92   Temp (!) 97.5 F (36.4 C) (Temporal)   Resp 16   Ht 5' 2 (1.575 m)   Wt 90.7 kg   SpO2 95%   BMI 36.58 kg/m  General:   Alert,  pleasant and cooperative in NAD Head:  Normocephalic and atraumatic. Lungs:  Clear to auscultation.    Heart:  Regular rate and rhythm.   Impression/Plan: Jessica Elliott is here for ophthalmic surgery.  Risks, benefits, limitations, and alternatives regarding ophthalmic surgery  have been reviewed with the patient.  Questions have been answered.  All parties agreeable.   MITTIE GASKIN, MD  08/08/2024, 7:36 AM

## 2024-08-08 NOTE — Transfer of Care (Signed)
 Immediate Anesthesia Transfer of Care Note  Patient: Jessica Elliott  Procedure(s) Performed: PHACOEMULSIFICATION, CATARACT, WITH IOL INSERTION (Left)  Patient Location: PACU  Anesthesia Type: MAC  Level of Consciousness: awake, alert  and patient cooperative  Airway and Oxygen Therapy: Patient Spontanous Breathing and Patient connected to supplemental oxygen  Post-op Assessment: Post-op Vital signs reviewed, Patient's Cardiovascular Status Stable, Respiratory Function Stable, Patent Airway and No signs of Nausea or vomiting  Post-op Vital Signs: Reviewed and stable  Complications: No notable events documented.

## 2024-08-08 NOTE — Op Note (Signed)
 OPERATIVE NOTE  Jessica Elliott [id number] 08/08/2024   PREOPERATIVE DIAGNOSIS:  Nuclear sclerotic cataract left eye. H25.12   POSTOPERATIVE DIAGNOSIS:    Nuclear sclerotic cataract left eye.     PROCEDURE:  Phacoemusification with posterior chamber intraocular lens placement of the left eye  Ultrasound time: Procedure(s): PHACOEMULSIFICATION, CATARACT, WITH IOL INSERTION 7.80 00:35.8 (Left)  LENS:   Implant Name Type Inv. Item Serial No. Manufacturer Lot No. LRB No. Used Action  LENS IOL TECNIS EYHANCE 23.0 - [id number] Intraocular Lens LENS IOL TECNIS EYHANCE 23.0 [id number] [employer]  Left 1 Implanted      SURGEON:  Dene FABIENE Etienne, MD   ANESTHESIA:  Topical with tetracaine  drops and 2% Xylocaine  jelly, augmented with 1% preservative-free intracameral lidocaine .    COMPLICATIONS:  None.   DESCRIPTION OF PROCEDURE:  The patient was identified in the holding room and transported to the operating room and placed in the supine position under the operating microscope.  The left eye was identified as the operative eye and it was prepped and draped in the usual sterile ophthalmic fashion.   A 1 millimeter clear-corneal paracentesis was made at the 1:30 position.  0.5 ml of preservative-free 1% lidocaine  was injected into the anterior chamber.  The anterior chamber was filled with Viscoat viscoelastic.  A 2.4 millimeter keratome was used to make a near-clear corneal incision at the 10:30 position.  .  A curvilinear capsulorrhexis was made with a cystotome and capsulorrhexis forceps.  Balanced salt solution was used to hydrodissect and hydrodelineate the nucleus.   Phacoemulsification was then used in stop and chop fashion to remove the lens nucleus and epinucleus.  The remaining cortex was then removed using the irrigation and aspiration handpiece. Provisc was then placed into the capsular bag to distend it for lens placement.  A lens was then injected into the capsular bag.  The  remaining viscoelastic was aspirated.   Wounds were hydrated with balanced salt solution.  The anterior chamber was inflated to a physiologic pressure with balanced salt solution.  No wound leaks were noted. Vigamox  0.2 ml of a 1mg  per ml solution was injected into the anterior chamber for a dose of 0.2 mg of intracameral antibiotic at the completion of the case.   Timolol  and Brimonidine  drops were applied to the eye.  The patient was taken to the recovery room in stable condition without complications of anesthesia or surgery.  [name] 08/08/2024, 7:58 AM

## 2024-08-08 NOTE — Anesthesia Postprocedure Evaluation (Signed)
 Anesthesia Post Note  Patient: Adrien Hoh  Procedure(s) Performed: PHACOEMULSIFICATION, CATARACT, WITH IOL INSERTION 7.80 00:35.8 (Left: Eye)  Patient location during evaluation: PACU Anesthesia Type: MAC Level of consciousness: awake and alert Pain management: pain level controlled Vital Signs Assessment: post-procedure vital signs reviewed and stable Respiratory status: spontaneous breathing, nonlabored ventilation, respiratory function stable and patient connected to nasal cannula oxygen Cardiovascular status: stable and blood pressure returned to baseline Postop Assessment: no apparent nausea or vomiting Anesthetic complications: no   No notable events documented.   Last Vitals:  Vitals:   08/08/24 0635 08/08/24 0759  BP: 139/62 (!) 126/53  Pulse: 92 86  Resp: 16 12  Temp: (!) 36.4 C (!) 36.1 C  SpO2: 95% 97%    Last Pain:  Vitals:   08/08/24 0759  TempSrc:   PainSc: 0-No pain                 Cordaryl Decelles C Damilola Flamm

## 2024-08-23 DIAGNOSIS — G4733 Obstructive sleep apnea (adult) (pediatric): Secondary | ICD-10-CM | POA: Diagnosis not present

## 2024-08-28 DIAGNOSIS — H35351 Cystoid macular degeneration, right eye: Secondary | ICD-10-CM | POA: Diagnosis not present

## 2024-09-06 DIAGNOSIS — I10 Essential (primary) hypertension: Secondary | ICD-10-CM | POA: Diagnosis not present

## 2024-09-06 DIAGNOSIS — R809 Proteinuria, unspecified: Secondary | ICD-10-CM | POA: Diagnosis not present

## 2024-09-06 DIAGNOSIS — Z79899 Other long term (current) drug therapy: Secondary | ICD-10-CM | POA: Diagnosis not present

## 2024-09-06 DIAGNOSIS — E1169 Type 2 diabetes mellitus with other specified complication: Secondary | ICD-10-CM | POA: Diagnosis not present

## 2024-09-06 DIAGNOSIS — E1129 Type 2 diabetes mellitus with other diabetic kidney complication: Secondary | ICD-10-CM | POA: Diagnosis not present

## 2024-09-06 DIAGNOSIS — E1159 Type 2 diabetes mellitus with other circulatory complications: Secondary | ICD-10-CM | POA: Diagnosis not present

## 2024-09-06 DIAGNOSIS — I152 Hypertension secondary to endocrine disorders: Secondary | ICD-10-CM | POA: Diagnosis not present

## 2024-09-06 DIAGNOSIS — Z794 Long term (current) use of insulin: Secondary | ICD-10-CM | POA: Diagnosis not present

## 2024-09-06 DIAGNOSIS — D649 Anemia, unspecified: Secondary | ICD-10-CM | POA: Diagnosis not present

## 2024-09-06 DIAGNOSIS — E113293 Type 2 diabetes mellitus with mild nonproliferative diabetic retinopathy without macular edema, bilateral: Secondary | ICD-10-CM | POA: Diagnosis not present

## 2024-09-06 DIAGNOSIS — F411 Generalized anxiety disorder: Secondary | ICD-10-CM | POA: Diagnosis not present

## 2024-09-06 DIAGNOSIS — E785 Hyperlipidemia, unspecified: Secondary | ICD-10-CM | POA: Diagnosis not present

## 2024-09-06 DIAGNOSIS — Z780 Asymptomatic menopausal state: Secondary | ICD-10-CM | POA: Diagnosis not present

## 2024-09-11 DIAGNOSIS — Z794 Long term (current) use of insulin: Secondary | ICD-10-CM | POA: Diagnosis not present

## 2024-09-11 DIAGNOSIS — Z23 Encounter for immunization: Secondary | ICD-10-CM | POA: Diagnosis not present

## 2024-09-11 DIAGNOSIS — E785 Hyperlipidemia, unspecified: Secondary | ICD-10-CM | POA: Diagnosis not present

## 2024-09-11 DIAGNOSIS — E669 Obesity, unspecified: Secondary | ICD-10-CM | POA: Diagnosis not present

## 2024-09-11 DIAGNOSIS — E1169 Type 2 diabetes mellitus with other specified complication: Secondary | ICD-10-CM | POA: Diagnosis not present

## 2024-09-11 DIAGNOSIS — E113293 Type 2 diabetes mellitus with mild nonproliferative diabetic retinopathy without macular edema, bilateral: Secondary | ICD-10-CM | POA: Diagnosis not present

## 2024-09-22 DIAGNOSIS — G4733 Obstructive sleep apnea (adult) (pediatric): Secondary | ICD-10-CM | POA: Diagnosis not present

## 2024-09-25 DIAGNOSIS — Z961 Presence of intraocular lens: Secondary | ICD-10-CM | POA: Diagnosis not present

## 2024-09-25 DIAGNOSIS — H35351 Cystoid macular degeneration, right eye: Secondary | ICD-10-CM | POA: Diagnosis not present

## 2024-10-11 DIAGNOSIS — D721 Eosinophilia, unspecified: Secondary | ICD-10-CM | POA: Diagnosis not present

## 2024-10-11 DIAGNOSIS — Z79899 Other long term (current) drug therapy: Secondary | ICD-10-CM | POA: Diagnosis not present

## 2024-10-11 DIAGNOSIS — D649 Anemia, unspecified: Secondary | ICD-10-CM | POA: Diagnosis not present

## 2024-10-18 DIAGNOSIS — M79672 Pain in left foot: Secondary | ICD-10-CM | POA: Diagnosis not present

## 2024-10-18 DIAGNOSIS — Z794 Long term (current) use of insulin: Secondary | ICD-10-CM | POA: Diagnosis not present

## 2024-10-18 DIAGNOSIS — M7989 Other specified soft tissue disorders: Secondary | ICD-10-CM | POA: Diagnosis not present

## 2024-10-18 DIAGNOSIS — E113293 Type 2 diabetes mellitus with mild nonproliferative diabetic retinopathy without macular edema, bilateral: Secondary | ICD-10-CM | POA: Diagnosis not present

## 2024-10-18 DIAGNOSIS — R202 Paresthesia of skin: Secondary | ICD-10-CM | POA: Diagnosis not present

## 2024-10-18 DIAGNOSIS — M79671 Pain in right foot: Secondary | ICD-10-CM | POA: Diagnosis not present

## 2024-10-23 ENCOUNTER — Other Ambulatory Visit: Payer: Self-pay | Admitting: Infectious Diseases

## 2024-10-23 DIAGNOSIS — M79671 Pain in right foot: Secondary | ICD-10-CM

## 2024-10-23 DIAGNOSIS — M7989 Other specified soft tissue disorders: Secondary | ICD-10-CM

## 2024-10-24 ENCOUNTER — Ambulatory Visit
Admission: RE | Admit: 2024-10-24 | Discharge: 2024-10-24 | Disposition: A | Discharge status: Home / Self Care | Source: Ambulatory Visit | Attending: Infectious Diseases | Admitting: Infectious Diseases

## 2024-10-24 DIAGNOSIS — M79672 Pain in left foot: Secondary | ICD-10-CM | POA: Insufficient documentation

## 2024-10-24 DIAGNOSIS — M79671 Pain in right foot: Secondary | ICD-10-CM | POA: Insufficient documentation

## 2024-10-24 DIAGNOSIS — M7989 Other specified soft tissue disorders: Secondary | ICD-10-CM | POA: Diagnosis not present

## 2024-10-30 DIAGNOSIS — E113293 Type 2 diabetes mellitus with mild nonproliferative diabetic retinopathy without macular edema, bilateral: Secondary | ICD-10-CM | POA: Diagnosis not present

## 2024-10-30 DIAGNOSIS — H35351 Cystoid macular degeneration, right eye: Secondary | ICD-10-CM | POA: Diagnosis not present

## 2024-10-30 DIAGNOSIS — Z961 Presence of intraocular lens: Secondary | ICD-10-CM | POA: Diagnosis not present

## 2024-10-30 DIAGNOSIS — H43821 Vitreomacular adhesion, right eye: Secondary | ICD-10-CM | POA: Diagnosis not present

## 2024-11-09 DIAGNOSIS — M79671 Pain in right foot: Secondary | ICD-10-CM | POA: Diagnosis not present

## 2024-11-09 DIAGNOSIS — Z794 Long term (current) use of insulin: Secondary | ICD-10-CM | POA: Diagnosis not present

## 2024-11-09 DIAGNOSIS — D721 Eosinophilia, unspecified: Secondary | ICD-10-CM | POA: Diagnosis not present

## 2024-11-09 DIAGNOSIS — E113293 Type 2 diabetes mellitus with mild nonproliferative diabetic retinopathy without macular edema, bilateral: Secondary | ICD-10-CM | POA: Diagnosis not present

## 2024-11-09 DIAGNOSIS — M79672 Pain in left foot: Secondary | ICD-10-CM | POA: Diagnosis not present

## 2024-11-09 DIAGNOSIS — R202 Paresthesia of skin: Secondary | ICD-10-CM | POA: Diagnosis not present

## 2024-11-22 DIAGNOSIS — G4733 Obstructive sleep apnea (adult) (pediatric): Secondary | ICD-10-CM | POA: Diagnosis not present
